# Patient Record
Sex: Male | Born: 1945 | Race: Black or African American | Hispanic: No | Marital: Married | Smoking: Former smoker
Health system: Southern US, Community
[De-identification: ages and names within clinical notes are randomized; demographics above are authoritative.]

## PROBLEM LIST (undated history)

## (undated) DIAGNOSIS — K219 Gastro-esophageal reflux disease without esophagitis: Secondary | ICD-10-CM

## (undated) DIAGNOSIS — D649 Anemia, unspecified: Secondary | ICD-10-CM

## (undated) DIAGNOSIS — M1990 Unspecified osteoarthritis, unspecified site: Secondary | ICD-10-CM

## (undated) DIAGNOSIS — I10 Essential (primary) hypertension: Secondary | ICD-10-CM

## (undated) DIAGNOSIS — Z809 Family history of malignant neoplasm, unspecified: Secondary | ICD-10-CM

## (undated) DIAGNOSIS — Z1379 Encounter for other screening for genetic and chromosomal anomalies: Secondary | ICD-10-CM

## (undated) HISTORY — DX: Family history of malignant neoplasm, unspecified: Z80.9

## (undated) HISTORY — PX: KNEE SURGERY: SHX244

## (undated) HISTORY — PX: SHOULDER SURGERY: SHX246

## (undated) HISTORY — PX: JOINT REPLACEMENT: SHX530

## (undated) HISTORY — DX: Encounter for other screening for genetic and chromosomal anomalies: Z13.79

---

## 2004-04-04 ENCOUNTER — Emergency Department (HOSPITAL_COMMUNITY): Admission: EM | Admit: 2004-04-04 | Discharge: 2004-04-04 | Payer: Self-pay | Admitting: Emergency Medicine

## 2006-09-08 ENCOUNTER — Emergency Department (HOSPITAL_COMMUNITY): Admission: EM | Admit: 2006-09-08 | Discharge: 2006-09-08 | Payer: Self-pay | Admitting: Emergency Medicine

## 2008-01-05 ENCOUNTER — Emergency Department (HOSPITAL_COMMUNITY): Admission: EM | Admit: 2008-01-05 | Discharge: 2008-01-05 | Payer: Self-pay | Admitting: *Deleted

## 2008-03-13 ENCOUNTER — Inpatient Hospital Stay (HOSPITAL_COMMUNITY): Admission: RE | Admit: 2008-03-13 | Discharge: 2008-03-20 | Payer: Self-pay | Admitting: Orthopedic Surgery

## 2009-05-07 ENCOUNTER — Inpatient Hospital Stay (HOSPITAL_COMMUNITY): Admission: RE | Admit: 2009-05-07 | Discharge: 2009-05-10 | Payer: Self-pay | Admitting: Otolaryngology

## 2011-04-01 LAB — BASIC METABOLIC PANEL
BUN: 16 mg/dL (ref 6–23)
CO2: 29 mEq/L (ref 19–32)
CO2: 26 mEq/L (ref 19–32)
Calcium: 8.5 mg/dL (ref 8.4–10.5)
Calcium: 10 mg/dL (ref 8.4–10.5)
Chloride: 110 mEq/L (ref 96–112)
Creatinine, Ser: 1.23 mg/dL (ref 0.4–1.5)
GFR calc Af Amer: >60 mL/min (ref >60)
GFR calc Af Amer: >60 mL/min (ref >60)
GFR calc non Af Amer: 59 mL/min — ABNORMAL LOW (ref >60)
Glucose, Bld: 134 mg/dL — ABNORMAL HIGH (ref 70–99)
Potassium: 4.7 mEq/L (ref 3.5–5.1)

## 2011-04-01 LAB — URINALYSIS, ROUTINE W REFLEX MICROSCOPIC
Bilirubin Urine: NEGATIVE
Glucose, UA: 100 mg/dL — AB
Hgb urine dipstick: NEGATIVE
Leukocytes, UA: NEGATIVE
Nitrite: NEGATIVE
Urobilinogen, UA: 1 mg/dL (ref 0.0–1.0)

## 2011-04-01 LAB — CBC
HCT: 23.2 % — ABNORMAL LOW (ref 39.0–52.0)
HCT: 25.7 % — ABNORMAL LOW (ref 39.0–52.0)
Hemoglobin: 8.6 g/dL — ABNORMAL LOW (ref 13.0–17.0)
Hemoglobin: 12.5 g/dL — ABNORMAL LOW (ref 13.0–17.0)
MCHC: 33.4 g/dL (ref 30.0–36.0)
MCHC: 33.4 g/dL (ref 30.0–36.0)
Platelets: 219 10*3/uL (ref 150–400)
RBC: 2.89 MIL/uL — ABNORMAL LOW (ref 4.22–5.81)
RBC: 3.28 MIL/uL — ABNORMAL LOW (ref 4.22–5.81)
RDW: 13 % (ref 11.5–15.5)
RDW: 13.7 % (ref 11.5–15.5)
WBC: 6.8 10*3/uL (ref 4.0–10.5)
WBC: 7.4 10*3/uL (ref 4.0–10.5)

## 2011-04-01 LAB — TYPE AND SCREEN: ABO/RH(D): A POS

## 2011-04-01 LAB — PROTIME-INR
INR: 1.1 (ref 0.00–1.49)
Prothrombin Time: 16 seconds — ABNORMAL HIGH (ref 11.6–15.2)
Prothrombin Time: 16.3 seconds — ABNORMAL HIGH (ref 11.6–15.2)

## 2011-04-01 LAB — DIFFERENTIAL
Basophils Relative: 1 % (ref 0–1)
Lymphocytes Relative: 26 % (ref 12–46)
Lymphs Abs: 1.3 10*3/uL (ref 0.7–4.0)
Monocytes Absolute: 0.5 10*3/uL (ref 0.1–1.0)
Neutro Abs: 2.8 10*3/uL (ref 1.7–7.7)
Neutrophils Relative %: 58 % (ref 43–77)

## 2011-05-06 NOTE — Op Note (Signed)
NAME:  Alexander Strickland, Alexander Strickland NO.:  [id number]   MEDICAL RECORD NO.:  [id number]          PATIENT TYPE:  INP   LOCATION:  5028                         FACILITY:  [facility]   PHYSICIAN:  Feliberto Gottron. Turner Daniels, M.D.   DATE OF BIRTH:  [birth year]   DATE OF PROCEDURE:  05/07/2009  DATE OF DISCHARGE:                               OPERATIVE REPORT   PREOPERATIVE DIAGNOSIS:  End-stage arthritis right knee.   POSTOPERATIVE DIAGNOSIS:  End-stage arthritis right knee.   PROCEDURE:  Right total knee arthroplasty using DePuy Sigma RP  components 5 femur and 5 tibia, 15-mm bearing, 41-mm patellar button,  double batch of DePuy 1 cement with 1500 mg of Zinacef.   SURGEON:  Feliberto Gottron.  Turner Daniels, MD   FIRST ASSISTANT:  Shirl Harris, PA-C   ANESTHETIC:  General endotracheal.   ESTIMATED BLOOD LOSS:  Minimal.   FLUID REPLACEMENT:  1800 mL of crystalloid.   DRAINS PLACED:  Foley catheter and two medium Hemovacs.   URINE OUTPUT:  300 mL.   TOURNIQUET TIME:  1 hour and 45 minutes.   INDICATIONS FOR PROCEDURE:  A 65 year old gentleman who was previously  had a left total knee that was done well, now desires elective right  total knee for end-stage arthritis with huge medial osteophytes, bare  bone, arthritic changes to the medial compartment, varus deformity, and  about a 20 degrees flexion contracture.  Risks and benefits of surgery  were discussed and were well known to the patient.  He desires elective  total knee arthroplasty to decrease pain and increase function and was  failed all conservative measures.   DESCRIPTION OF PROCEDURE:  The patient was identified by armband and  received preoperative IV antibiotics in the holding area at [facility] and also underwent right femoral nerve block, tolerated the  procedure well, was taken to operating room #10 where the appropriate  anesthetic monitors were reattached and general endotracheal anesthesia  induced with the patient in supine  position.  Foley catheter was  inserted.  Tourniquet applied high to the right thigh, foot positioner  lateral post applied to the table and the right lower extremity was  prepped and draped in usual sterile fashion from the ankle to the  midthigh.  Time-out procedure performed.  Limb wrapped with an Esmarch  bandage, tourniquet inflated to 350 mmHg, began the operation by making  anterior midline incision going from one handbreadth above the patella  over the patella and 1 cm medial to and 3 cm distal to the tibial  tubercle.  Because of the varus contracture and the flexion contracture,  the inferior end of the incision was about 3 cm longer than usual.  Small bleeders in the skin and subcutaneous tissue identified and  cauterized.  The transverse retinaculum was then incised and reflected  medially allowing medial parapatellar arthrotomy.  The superficial  medial collateral ligament was then elevated off the medial flare of the  tibia going from anterior to posterior, leaving and intact distally.  Prepatellar fat pad was resected and then the patella was everted  exposing huge osteophytes  medially which were removed with a 1-inch  osteotome from the medial femoral condyle, the superior aspect of the  trochlea, and the lateral side of the femoral condyle.  There was also a  large medial osteophyte and even after removal of all these osteophytes,  the patient was still sized for a 5.  We then took a one three-quarter  inch osteotome to remove large notch osteophytes as well as the cruciate  ligaments and we finally were able to get the knee to flex up past 100  degrees to about 130.  We then placed a posterior medial Z-retractor, a  McCullough retractor through the notch and lateral Hohmann retractor and  entered the proximal tibia with a DePuy step drill followed by the IM  rod and a 2-degree posterior slope cutting guide.  Because of the  flexion contracture, we removed about 7-8 mm of  bone medially and 12 mm  of bone laterally.  Satisfied with the proximal tibia resection, we then  direct our attention to the distal femur and entered it 2 mm anterior to  the PCL origin with a step drill followed by a 5-degree right distal  femoral cutting guide set for an 11-mm distal cut.  This was pinned into  placed and when the distal cut accomplished, we sized for a #5 femoral  component set the guide at 3 degrees of external rotation and screwed in  with chamfer cutting guide, performed an anterior-posterior chamfer cuts  without difficulty followed by the Sigma RP box cut.  The patella was  then measured at 29 mm felt to be set for a 41 button and the cutting  guide set at 18 removing the posterior 11 mm of the patella.  We sized  for 41 button and drilled the patella.  The knee was then once again  hyperflexed and sized for a #5 tibial baseplate.  This was pinned into  place followed by the smokestack and the conical reamer and then the  Delta fin keel trial punch was inserted.  The 5 right femoral component  trial was then hammered onto the femur and the lugs drilled.  We tried a  12-15 mm bearing and because of the patient's size.  The 15 bearing had  the best fit and fill and did come to full extension.  At this point,  the trial components were removed, all bony surfaces were water picked,  clean, dried with suction and sponges.  A double batch of DePuy 1 cement  was mixed at the back table and applied to all bony metallic mating  surfaces.  In order, we hammered into place a #5 tibial baseplate and  removed the excess cement.  A #5 right femoral component and removed the  excess cement.  The 15-mm bearing was then snapped into place, the knee  reduced and held in full extension.  The 41 button was then clamped onto  the patella and excess cement also removed.  Medium Hemovac drains were  placed from the anterolateral approach.  The wound was then pulse  lavaged at one more  time with normal saline solution.  After the cement  cured, we checked our patellar tracking, it was excellent.  The  parapatellar arthrotomy was closed with running #1 Vicryl suture, the  subcutaneous tissue with 0 and 2-0 undyed Vicryl suture and the skin  with skin staples.  A dressing of Xeroform, 4 x 4 dressing sponges,  Webril, and Ace wrap and a knee immobilizer  because of the preexisting  flexion contracture then applied.  The tourniquet was let down.  The  patient awakened and taken to recovery room without difficulty.      Feliberto Gottron. Turner Daniels, M.D.  Electronically Signed     FJR/MEDQ  D:  05/07/2009  T:  05/07/2009  Job:  [id number]

## 2011-05-06 NOTE — Discharge Summary (Signed)
NAME:  Alexander Strickland, Alexander Strickland NO.:  [id number]   MEDICAL RECORD NO.:  [id number]          PATIENT TYPE:  INP   LOCATION:  5028                         FACILITY:  [facility]   PHYSICIAN:  Feliberto Gottron. Turner Daniels, M.D.   DATE OF BIRTH:  08/02/1946   DATE OF ADMISSION:  05/07/2009  DATE OF DISCHARGE:  05/10/2009                               DISCHARGE SUMMARY   CHIEF COMPLAINT:  Right knee pain.   HISTORY OF PRESENT ILLNESS:  This is a 65 year old gentleman who  complains of severe unremitting pain in his right knee despite  conservative treatment.  He desires a surgical intervention at this  time.  All risks and benefits were discussed with the patient.   PAST MEDICAL HISTORY:  Significant for hypertension.   ALLERGIES:  He has no known drug allergies.   CURRENT MEDICATIONS:  1. Simvastatin 40 mg one p.o. daily.  2. Atenolol 25 mg one p.o. daily.  3. Hydrocodone 5/500 mg one p.o. q.6 hours p.r.n. pain.   PAST SURGICAL HISTORY:  He had a left total knee arthroplasty in 2009.  He had right knee surgery in 1976.   SOCIAL HISTORY:  He smoked cigars and denies use of alcohol.   FAMILY HISTORY:  Is positive for diabetes mellitus, hypertension and  coronary artery disease.   PHYSICAL EXAMINATION:  Gross examination of the right knee demonstrates  the patient's range of motion to be 5-100 degrees.  He has  patellofemoral crepitus and a varus deformity.  He is neurovascularly  intact.  X-rays demonstrated bone on bone degenerative joint disease of  the right knee.   PREOP LABS:  White blood cells 4.8, red blood cells 4.20, hemoglobin  12.5, hematocrit 37.3, platelets 219.  PT 12.4, INR 0.9, PTT 25, sodium  141, potassium 5.2, chloride 110, BUN 20, creatinine 1.08.  Urinalysis  was within normal limits.   HOSPITAL COURSE:  Alexander Strickland was admitted to [facility] on May 07, 2009, when he underwent right total knee arthroplasty.  The procedure  was performed by Dr. Feliberto Gottron. Turner Daniels and  the patient tolerated it well.  Two Hemovac drains were  placed into the right knee.  A perioperative  Foley catheter was also placed.  The patient was transferred from  recovery to the orthopedic unit and placed on Lovenox and Coumadin for  DVT prophylaxis.  On the first postoperative day the patient was awake  and alert.  Hemoglobin was 9.8.  Surgical drains were removed without  difficulty.  His Foley catheter was removed after physical therapy.  On  the second postoperative day the patient reported improvement in the  nausea he was experiencing on the previous day.  He was tolerating p.o.  intake and passing flatus.  He had ambulated 160 feet with physical  therapy.  Hemoglobin was 8.6 but he denied any shortness of breath or  dizziness.  Surgical dressing was cleaned.  It was changed and his  incision was found to be benign.  On the third postoperative day the  patient was eating well and ambulating well with physical therapy.  Hemoglobin  was 7.8 but he continued to deny dizziness, fatigue or  shortness of breath so he was not transfused.  All risks and benefits of  avoiding transfusion were discussed with the patient.  He has no history  of heart disease so this seemed to be a reasonable course of action.  He  was discharged to a skilled nursing facility.   DISPOSITION:  The patient was discharged to skilled nursing on May 10, 2009.  The skilled nursing facility will manage his wound, Coumadin and  physical therapy.  He is weightbearing as tolerated and will return to  the clinic in 10 days to see Dr. Turner Daniels for x-rays and staple removal.   DISCHARGE MEDICATIONS:  1. As per the HMR with the addition of Percocet 5 mg one to two      tablets p.o. q.4 hours p.r.n. pain.  2. Coumadin dosed by the pharmacy to take as directed with a target      INR of 1.5 to 2.   FINAL DIAGNOSIS:  End-stage degenerative joint disease of the right knee  with a secondary diagnosis of acute blood loss  anemia.      Alexander Harris, PA      Feliberto Gottron. Turner Daniels, M.D.  Electronically Signed    JW/MEDQ  D:  05/10/2009  T:  05/10/2009  Job:  [id number]

## 2011-05-06 NOTE — Op Note (Signed)
NAME:  Alexander Strickland, Alexander Strickland               ACCOUNT NO.:  [id number]   MEDICAL RECORD NO.:  [id number]          PATIENT TYPE:  INP   LOCATION:  2550                         FACILITY:  [facility]   PHYSICIAN:  Feliberto Gottron. Turner Daniels, M.D.   DATE OF BIRTH:  1946/12/10   DATE OF PROCEDURE:  03/13/2008  DATE OF DISCHARGE:                               OPERATIVE REPORT   PREOPERATIVE DIAGNOSIS:  End-stage arthritis, left knee with the 20  degrees flexion contracture, 10 degrees varus deformity.   POSTOPERATIVE DIAGNOSIS:  End-stage arthritis, left knee with the 20  degrees flexion contracture, 10 degrees varus deformity.   PROCEDURE:  Left total knee arthroplasty using DePuy Sigma RP components  cemented 5 femur, 5 tibia, 41-mm patellar button, 10-mm Sigma RP spacer.   SURGEON:  Feliberto Gottron.  Turner Daniels, MD   FIRST ASSISTANT:  Skip Mayer PA-C.   ANESTHETIC:  Endotracheal with femoral nerve block.   ESTIMATED BLOOD LOSS:  Minimal.   TOURNIQUET TIME:  1 hour 15 minutes.   DRAINS PLACED:  Two medium Hemovacs and a Foley catheter.   URINE OUTPUT:  300 mL.   INDICATIONS FOR PROCEDURE:  A 65 year old gentleman who is a [place] [employer]  veteran, presented to our office presented to our office about a month  ago with end-stage arthritis of both knees and a permission slip from  the Department of [facility] allowing him to seek knee arthroplasty  on the civilian economy.  Plain radiographs showed bone-on-bone  arthritic changes with huge osteophytes erosions medially of the femur  into the tibia.  He had truly had end-stage arthritis and severe,  debilitating pain, inability to walk more than about a block at a time.  Risks and benefits of surgery were discussed, questions answered.   DESCRIPTION OF PROCEDURE:  The patient identified by armband, taken to  the block area [facility] where a left femoral nerve block was  placed.  He was then taken to operating room 1, appropriate anesthetic  monitors were  attached and general endotracheal anesthesia induced with  the patient in supine position.  Tourniquet applied high to the left  thigh and the left lower extremity prepped and draped in usual sterile  fashion from the ankle to the tourniquet.  Lateral post and foot  positioner applied to the table.  Limb wrapped with an Esmarch bandage,  tourniquet inflated to 350 mmHg and we began the procedure by making an  anterior midline incision through the skin and subcutaneous tissue down  to the transverse retinaculum which was incised in line with skin  incision.  This allowed a medial parapatellar arthrotomy and eversion of  the patella.  The patient had huge peripheral osteophytes of the patella  that were removed with rongeurs.  Prepatellar fat pad was resected.  Superficial medial collateral ligament was elevated from anterior to  posterior off the anterior aspect of the tibia revealing once again a  large osteophyte and then taken down the metaphyseal flare of the tibia  about 6 cm leaving it intact distally.  Very large peripheral  osteophytes of the femur were  also removed.  This required a 1 inch  osteotome which was very impressive and large notch osteophytes were  also removed revealing the ACL and PCL which were then removed with  electrocautery and again we used an osteotome to remove osteophytes from  the tibial spines the overhanging osteophyte of the medial proximal  tibia going posterior was also removed with rongeurs.  Once this been  accomplished with the knee hyperflexed, we entered the proximal tibia  with a DePuy step drill followed by intramedullary rod and a 0 degrees  slope proximal tibial cutting guide.  This was set allowing a resection  of about 12 mm of bone laterally, only about 4 or 5 mm of bone medially  because of the varus deformity.  The posterior structures were protected  with a Z retractor posterior, a McHale retractor and a lateral Homan  retractor.  The  proximal tibial cut was accomplished without difficulty.  We then entered the distal femur 2 mm anterior to the PCL origin with  the DePuy step drill followed by the IM rod set at rod set at 5 degrees  left again with a 12 mm distal femoral cut.  This was pinned along the  epicondylar axis and the distal femoral cut accomplished.  We sized for  #5 right femoral component and pinned the cutting guide in 3 degrees of  external rotation to allow for the medial contracture.  The anterior-  posterior and chamfer cuts were then accomplished without difficulty  followed by the Sigma RP box cut.  We then checked our extension gap  which was excellent for the 10 mm spacer and the flexion gap was also in  good position for the 10 mm spacer.  At this point, the everted patella  was measured at 25 mm because of the erosion.  We set the cutting guide  at 16 anticipating a 41-mm button, performed the cut on the posterior  aspect of the patella, sized for a 41 button and drilled the lollipop,  the knee was once again hyperflexed.  We sized the tibia to a #5 tibial  baseplate which was pinned into place followed by the smokestack and the  smokestack reamer and the Delta fin keel cut.  The bullet with the Delta  fin trial was then hammered into place.  The 5 left femoral trial was  hammered into place.  The lugs were drilled, a 10 mm spacer was placed  and the knee came to full extension and flexed to 135 degrees and the  varus deformity and flexion contracture were gone.  Ligaments were  stable.  At this point the trial components removed.  All bony surfaces  were water picked clean, dried with suction and sponges, a double batch  of DePuy HV cement with 1500 mg of Zinacef was then mixed, applied to  all bony and metallic mating surfaces and in order we hammered into  place a #5 tibial baseplate and removed excess cement, a 5 left femoral  component, removed excess cement and a 41-mm patellar button was   squeezed into place and held with a clamp.  A 10-mm Sigma RP spacer to  fit the #5 femoral component was then placed on the tibial baseplate and  the knee reduced.  The knee came to full extension and was held in 5  degrees of flexion as the cement hardened.  Medium Hemovac drains were  placed from a lateral approach.  The wound was water picked clean one  more time after the cement hardened.  The patella was unclamped, we  checked tracking one more time.  No thumb pressure was low required on  the patella and the parapatellar arthrotomy was then closed with running  #1 Vicryl suture.  Subcutaneous tissue was closed with 0-0 and 2-0  undyed Vicryl suture and the skin with skin staples.  Dressing of  Xeroform, 4x4 dressing sponges, Webril and Ace wrap applied.  Tourniquet  let down.  The patient awakened and taken to the recovery room without  difficulty.      Feliberto Gottron. Turner Daniels, M.D.  Electronically Signed     FJR/MEDQ  D:  03/13/2008  T:  03/13/2008  Job:  [id number]

## 2011-05-06 NOTE — Discharge Summary (Signed)
NAME:  Alexander Strickland, Alexander Strickland NO.:  [id number]   MEDICAL RECORD NO.:  [id number]          PATIENT TYPE:  INP   LOCATION:  5021                         FACILITY:  [facility]   PHYSICIAN:  Feliberto Gottron. Turner Daniels, M.D.   DATE OF BIRTH:  April 11, 1946   DATE OF ADMISSION:  03/13/2008  DATE OF DISCHARGE:  03/16/2008                               DISCHARGE SUMMARY   FINAL DIAGNOSES:  End-stage degenerative joint disease of the left knee.   PROCEDURES WHILE IN THE HOSPITAL:  Left total knee arthroplasty.   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 Wadie Lessen office approximately 1  month prior to this admission with end-stage arthritis of both knees and  a permission slip from the Department of [facility] allowing him  to seek total knee arthroplasty in the civilian economy.  Plain  radiograph showed bone on bone arthritic changes with huge osteophytes  and positive erosions medially off the femur and at the tibia with end-  stage arthritis with severe debilitating pain.  He has inability to walk  more than a block at a time.  Risks and benefits of surgery discussed  and questions were answered, and he wishes to proceed with a total knee.   He has no known drug allergies.   MEDICATIONS AT THE TIME OF ADMISSION:  Vicodin and atenolol.   PAST MEDICAL HISTORY:  Usual childhood diseases.  Adult history:  Hypertension, DJD.   SURGICAL HISTORY:  Right knee in 1976 for a scope.  Left shoulder 2005.   SOCIAL HISTORY:  Positive for tobacco at 1 pack per day.  Positive  ethanol occasionally.  No IV drug abuse.  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   FAMILY HISTORY:  Father died at age 39 with a history of hypertension,  CHF, and diabetes.  Mother died at 21 with a history of Alzheimer's and  cancer.   REVIEW OF SYSTEMS:  Positive for glasses.  The patient denies any  shortness of breath, chest pain, or recent  illness.   EXAM:  The patient's vital signs at the time of admission exam were  97.3, pulse 60, respirations 16, blood pressure 151/100.  The patient is  6 feet, 216 pounds male.  HEENT:  Normocephalic, atraumatic.  TMs clear.  Pupils are equal, round,  and reactive to light and accommodation.  Throat benign.  He has upper  and lower dentures.  NECK:  Supple.  Full range of motion.  CHEST:  Clear to auscultation and percussion.  HEART:  Regular rate and rhythm.  ABDOMEN:  Soft and nontender.  No masses.  Bowel sounds 2+.  EXTREMITIES:  Left knee range of motion 10-110 degrees with positive  pain, crepitus, and 10 degree varus deformity.  SKIN:  Within normal limits.   X-ray shows bone on bone medial patella and femoral components left  greater than right knees.  Preoperative labs including CBC, CMET, chest  x-ray, EKG, PT/PTT, were all within normal limits with the exception of  a potassium  of 5.5, glucose 119.   HOSPITAL COURSE:  The patient, on date of admission, was seen in the  operating room at [facility] where he underwent a left total knee  arthroplasty using DuPuySigma RP components,  #5 femur, #5 tibia, 41 mm  patellar button, 10 mm Sigma spacer.  Components were all cemented with  polymethyl methacrylate that had been embedded with antibiotics.  The  patient was placed on perioperative antibiotics, placed on postoperative  Coumadin prophylaxis with a target INR 1.5 to 2.  He was placed on PCA  Dilaudid pain control pump.  He was placed on Lovenox for bridging  therapy until he became therapeutic on Coumadin.  Physical therapy was  begun in the postoperative period, even in the PACU with a cpm, and  physical therapy was begun in earnest.  Weightbearing as tolerated the  following day.  Postoperative day 1 the patient was awake and alert, in  moderate pain, 4/10, taking p.o. well.  No nausea or vomiting.  Hemoglobin 9.7,  WBC 7.0, vital signs stable.  He was afebrile, INR  1.1.  Dressing was clean and dry.  Hemovac was discontinued.  Urine output 400  mL, otherwise stable.  Discharge planning was begun and discussion was  made regarding going to short-term stay at a skilled nursing facility  for rehab bed, and bed offers were requested.  Postoperative day 2 the  patient was complaining of moderate pain of the left knee.  T-max 99.3,  vital signs stable.  he was alert and oriented.  Dressing was dry.  Calf  was soft and nontender.  He was otherwise stable.  His laboratory work  on that date showed a drop in hemoglobin to 8.6, but he was asymptomatic  for any type of anemia.  Foley was discontinued without difficulty, as  was his PCA.  Postoperative day 3 the patient was awake and alert,  walking the hallway.  No bowel movement as of yet, but positive flatus.  Taking p.o. well.  Vital signs stable.  Dressing was dry.  Neurovascularly intact.  Hemoglobin of 8.5.  No dizziness, fatigue, or  other symptoms of anemia.  Wound was clean and dry.  Skilled nursing bed  became available for rehab physician.  He was transferred on that date  to the skilled nursing facility.   FINAL DIAGNOSIS:  Osteoarthritis, left knee, procedure in hospital left  total knee arthroplasty.   MEDICATIONS AT TIME OF DISCHARGE:  1. Coumadin per pharmacy protocol with a target INR of 1.5 to 2 for 2      weeks postoperatively.  2. Colace 100 mg b.i.d.  3. Tenormin 25 mg p.o. daily.  4. Senokot as needed for laxative or laxative of choice.  5. Fleets enema as needed.  6. Percocet 1 to 2 q.4h p.r.n. pain 5/325.  7. Tylenol 325 one to two q.4h p.r.n. pain of minor nature or      temperature greater than 101.  8. Reglan 10 mg p.o. q.8 as needed for nausea.  9. Phenergan 12.5 to 25 mg q.6 p.r.n. nausea.  10.Robaxin 500 mg p.o. q.6h p.r.n. spasm.  11.Benadryl as needed for sleep.   He should return to clinic in 1 week's time for followup, check with Dr.  Turner Daniels calling [phone] for  followup check.   ACTIVITIES:  Weightbearing as tolerated with total knee precautions  using a walker.  He may shower postoperative day 7 in a seated shower  chair with assistance.  Dressing changes daily  or as needed keeping the  wound clean and dry.  He should continue with CPM until he reaches 90  degrees easily with active range of motion 5 hours a day, increasing by  10 degrees by each day for a total of 5 hours each day.   DIET:  Regular.      Laural Benes. Jannet Mantis.      Feliberto Gottron. Turner Daniels, M.D.  Electronically Signed    JBR/MEDQ  D:  03/16/2008  T:  03/16/2008  Job:  [id number]

## 2011-09-15 LAB — BASIC METABOLIC PANEL
BUN: 15
CO2: 27
Calcium: 8.7
GFR calc non Af Amer: >60
Glucose, Bld: 166 — ABNORMAL HIGH

## 2011-09-15 LAB — TYPE AND SCREEN
ABO/RH(D): A POS
Antibody Screen: NEGATIVE

## 2011-09-15 LAB — ABO/RH: ABO/RH(D): A POS

## 2011-09-15 LAB — PROTIME-INR
INR: 1.1
INR: 1.3
INR: 2.2 — ABNORMAL HIGH
Prothrombin Time: 12.1
Prothrombin Time: 16.5 — ABNORMAL HIGH
Prothrombin Time: 16.6 — ABNORMAL HIGH
Prothrombin Time: 26.1 — ABNORMAL HIGH
Prothrombin Time: 26.2 — ABNORMAL HIGH

## 2011-09-15 LAB — CBC
HCT: 39.7
MCHC: 33.1
MCHC: 33.6
MCHC: 33.7
MCV: 89.3
Platelets: 185
Platelets: 203
Platelets: 228
RBC: 2.86 — ABNORMAL LOW
RBC: 4.47
RDW: 13.4
RDW: 13.6
RDW: 13.7
WBC: 7.3
WBC: 7.4

## 2011-09-15 LAB — URINALYSIS, ROUTINE W REFLEX MICROSCOPIC
Glucose, UA: NEGATIVE
Hgb urine dipstick: NEGATIVE
Specific Gravity, Urine: 1.025
pH: 5

## 2011-09-15 LAB — COMPREHENSIVE METABOLIC PANEL
ALT: 31
AST: 25
Alkaline Phosphatase: 71
GFR calc Af Amer: >60
GFR calc non Af Amer: >60
Potassium: 5.5 — ABNORMAL HIGH
Sodium: 140

## 2011-09-15 LAB — HEMOGLOBIN AND HEMATOCRIT, BLOOD: Hemoglobin: 10.9 — ABNORMAL LOW

## 2011-09-15 LAB — DIFFERENTIAL
Lymphocytes Relative: 41
Monocytes Absolute: 0.5
Neutrophils Relative %: 45

## 2012-07-03 ENCOUNTER — Emergency Department (HOSPITAL_COMMUNITY): Payer: Medicare Other

## 2012-07-03 ENCOUNTER — Encounter (HOSPITAL_COMMUNITY): Payer: Self-pay | Admitting: *Deleted

## 2012-07-03 ENCOUNTER — Observation Stay (HOSPITAL_COMMUNITY)
Admission: EM | Admit: 2012-07-03 | Discharge: 2012-07-06 | Disposition: A | Discharge status: Home / Self Care | Payer: Medicare Other | Attending: Internal Medicine | Admitting: Internal Medicine

## 2012-07-03 DIAGNOSIS — I10 Essential (primary) hypertension: Secondary | ICD-10-CM | POA: Diagnosis present

## 2012-07-03 DIAGNOSIS — R079 Chest pain, unspecified: Secondary | ICD-10-CM

## 2012-07-03 DIAGNOSIS — R001 Bradycardia, unspecified: Secondary | ICD-10-CM | POA: Diagnosis present

## 2012-07-03 DIAGNOSIS — R42 Dizziness and giddiness: Principal | ICD-10-CM | POA: Insufficient documentation

## 2012-07-03 DIAGNOSIS — D649 Anemia, unspecified: Secondary | ICD-10-CM | POA: Insufficient documentation

## 2012-07-03 DIAGNOSIS — I498 Other specified cardiac arrhythmias: Secondary | ICD-10-CM | POA: Insufficient documentation

## 2012-07-03 DIAGNOSIS — T448X5A Adverse effect of centrally-acting and adrenergic-neuron-blocking agents, initial encounter: Secondary | ICD-10-CM | POA: Insufficient documentation

## 2012-07-03 DIAGNOSIS — R0789 Other chest pain: Secondary | ICD-10-CM | POA: Insufficient documentation

## 2012-07-03 HISTORY — DX: Anemia, unspecified: D64.9

## 2012-07-03 HISTORY — DX: Essential (primary) hypertension: I10

## 2012-07-03 LAB — CBC WITH DIFFERENTIAL/PLATELET
Eosinophils Absolute: 0.2 10*3/uL (ref 0.0–0.7)
Hemoglobin: 11.1 g/dL — ABNORMAL LOW (ref 13.0–17.0)
Lymphocytes Relative: 29 % (ref 12–46)
Lymphs Abs: 1.8 10*3/uL (ref 0.7–4.0)
MCH: 28.3 pg (ref 26.0–34.0)
Monocytes Relative: 8 % (ref 3–12)
Neutro Abs: 3.8 10*3/uL (ref 1.7–7.7)
Neutrophils Relative %: 59 % (ref 43–77)
Platelets: 218 10*3/uL (ref 150–400)
RBC: 3.92 MIL/uL — ABNORMAL LOW (ref 4.22–5.81)
WBC: 6.4 10*3/uL (ref 4.0–10.5)

## 2012-07-03 LAB — URINALYSIS, ROUTINE W REFLEX MICROSCOPIC
Glucose, UA: 100 mg/dL — AB
Hgb urine dipstick: NEGATIVE
Leukocytes, UA: NEGATIVE
Protein, ur: NEGATIVE mg/dL
Specific Gravity, Urine: 1.027 (ref 1.005–1.030)
pH: 5.5 (ref 5.0–8.0)

## 2012-07-03 LAB — CARDIAC PANEL(CRET KIN+CKTOT+MB+TROPI): Total CK: 279 U/L — ABNORMAL HIGH (ref 7–232)

## 2012-07-03 LAB — POCT I-STAT, CHEM 8
BUN: 21 mg/dL (ref 6–23)
Calcium, Ion: 1.23 mmol/L (ref 1.13–1.30)
Creatinine, Ser: 1.2 mg/dL (ref 0.50–1.35)
Glucose, Bld: 106 mg/dL — ABNORMAL HIGH (ref 70–99)
TCO2: 25 mmol/L (ref 0–100)

## 2012-07-03 LAB — TROPONIN I: Troponin I: <0.3 ng/mL (ref <0.30)

## 2012-07-03 MED ORDER — SODIUM CHLORIDE 0.9 % IV SOLN
INTRAVENOUS | Status: DC
  Administered 2012-07-03 – 2012-07-06 (×3): via INTRAVENOUS

## 2012-07-03 MED ORDER — ONDANSETRON HCL 4 MG PO TABS: 4.0000 mg | ORAL_TABLET | Freq: Four times a day (QID) | ORAL | Status: DC | PRN

## 2012-07-03 MED ORDER — HYDRALAZINE HCL 20 MG/ML IJ SOLN
10.0000 mg | Freq: Four times a day (QID) | INTRAMUSCULAR | Status: DC | PRN
  Administered 2012-07-04: 10 mg via INTRAVENOUS
  Filled 2012-07-03: qty 1

## 2012-07-03 MED ORDER — ACETAMINOPHEN 650 MG RE SUPP: 650.0000 mg | Freq: Four times a day (QID) | RECTAL | Status: DC | PRN

## 2012-07-03 MED ORDER — ENOXAPARIN SODIUM 40 MG/0.4ML ~~LOC~~ SOLN
40.0000 mg | SUBCUTANEOUS | Status: DC
  Administered 2012-07-04 – 2012-07-05 (×3): 40 mg via SUBCUTANEOUS
  Filled 2012-07-03 (×5): qty 0.4

## 2012-07-03 MED ORDER — ACETAMINOPHEN 325 MG PO TABS
650.0000 mg | ORAL_TABLET | Freq: Four times a day (QID) | ORAL | Status: DC | PRN
  Administered 2012-07-04 (×2): 650 mg via ORAL
  Filled 2012-07-03 (×2): qty 2
  Filled 2012-07-03: qty 1

## 2012-07-03 MED ORDER — ATROPINE SULFATE 0.1 MG/ML IJ SOLN: 0.5000 mg | INTRAMUSCULAR | Status: DC | PRN

## 2012-07-03 MED ORDER — OXYCODONE HCL 5 MG PO TABS
5.0000 mg | ORAL_TABLET | ORAL | Status: DC | PRN
  Administered 2012-07-03 – 2012-07-06 (×6): 5 mg via ORAL
  Filled 2012-07-03 (×7): qty 1

## 2012-07-03 MED ORDER — SODIUM CHLORIDE 0.45 % IV SOLN
INTRAVENOUS | Status: DC
  Administered 2012-07-03: 75 mL/h via INTRAVENOUS

## 2012-07-03 MED ORDER — ALUM & MAG HYDROXIDE-SIMETH 200-200-20 MG/5ML PO SUSP: 30.0000 mL | Freq: Four times a day (QID) | ORAL | Status: DC | PRN

## 2012-07-03 MED ORDER — PANTOPRAZOLE SODIUM 40 MG PO TBEC
80.0000 mg | DELAYED_RELEASE_TABLET | Freq: Every day | ORAL | Status: DC
  Administered 2012-07-04 – 2012-07-05 (×2): 80 mg via ORAL
  Filled 2012-07-03 (×3): qty 2

## 2012-07-03 MED ORDER — HYDROMORPHONE HCL PF 1 MG/ML IJ SOLN
0.5000 mg | INTRAMUSCULAR | Status: DC | PRN
  Administered 2012-07-03 – 2012-07-04 (×2): 1 mg via INTRAVENOUS
  Filled 2012-07-03 (×3): qty 1

## 2012-07-03 MED ORDER — ONDANSETRON HCL 4 MG/2ML IJ SOLN: 4.0000 mg | Freq: Four times a day (QID) | INTRAMUSCULAR | Status: DC | PRN

## 2012-07-03 MED ORDER — ASPIRIN EC 325 MG PO TBEC
325.0000 mg | DELAYED_RELEASE_TABLET | Freq: Every day | ORAL | Status: DC
  Administered 2012-07-04 – 2012-07-06 (×4): 325 mg via ORAL
  Filled 2012-07-03 (×5): qty 1

## 2012-07-03 MED ORDER — ZOLPIDEM TARTRATE 5 MG PO TABS: 5.0000 mg | ORAL_TABLET | Freq: Every evening | ORAL | Status: DC | PRN

## 2012-07-03 MED ORDER — OMEPRAZOLE MAGNESIUM 20 MG PO TBEC: 40.0000 mg | DELAYED_RELEASE_TABLET | Freq: Two times a day (BID) | ORAL | Status: DC

## 2012-07-03 NOTE — ED Notes (Signed)
Pt now complains of chest pain. 

## 2012-07-03 NOTE — ED Provider Notes (Signed)
History     CSN: [id number]  Arrival date & time 07/03/12  1423   First MD Initiated Contact with Patient 07/03/12 1556      Chief Complaint  Patient presents with  . Chest Pain  . Dizziness  . Neck Pain  . Shoulder Pain    (Consider location/radiation/quality/duration/timing/severity/associated sxs/prior treatment) Patient is a 66 y.o. male presenting with chest pain, neck pain, and shoulder pain. The history is provided by the patient.  Chest Pain Primary symptoms include dizziness. Pertinent negatives for primary symptoms include no shortness of breath, no abdominal pain, no nausea and no vomiting.  Dizziness does not occur with nausea, vomiting or weakness.  Pertinent negatives for associated symptoms include no numbness and no weakness.    Neck Pain  Associated symptoms include chest pain. Pertinent negatives include no numbness, no headaches and no weakness.  Shoulder Pain Associated symptoms include chest pain. Pertinent negatives include no abdominal pain, no headaches and no shortness of breath.   patient's had episodes of dizziness since last night. They come and go. He states that he is a new will also have some pain in his chest neck and shoulder. No shortness of breath or nausea he states the dizziness is that he feels unsteady. He does not describe lightheadedness or feelings of surrounding moving. He states he feels as if his feet would not keep up with him. He's had a dull chest pain and time. No fevers. He's had a dull headache. A trauma. He was able to ride his bike today for 10 miles without difficulty. He's had episodes like this recently, but  never before yesterday.  Past Medical History  Diagnosis Date  . Hypertension   . Anemia     Past Surgical History  Procedure Date  . Knee surgery     Bilateral TKRs  . Shoulder surgery     Left Rotator Cuff Repair    Family History  Problem Relation Age of Onset  . Coronary artery disease Father   . Coronary  artery disease Brother   . Coronary artery disease Sister     X 2  . Diabetes type II Father   . Diabetes type II Brother   . Heart failure Mother     History  Substance Use Topics  . Smoking status: Former Games developer  . Smokeless tobacco: Not on file  . Alcohol Use: No     History of ETOH abuse, no drinks x3 years      Review of Systems  Constitutional: Negative for activity change and appetite change.  HENT: Positive for neck pain. Negative for neck stiffness.   Eyes: Negative for pain.  Respiratory: Negative for chest tightness and shortness of breath.   Cardiovascular: Positive for chest pain. Negative for leg swelling.  Gastrointestinal: Negative for nausea, vomiting, abdominal pain and diarrhea.  Genitourinary: Negative for flank pain.  Musculoskeletal: Negative for back pain.  Skin: Negative for rash.  Neurological: Positive for dizziness. Negative for weakness, numbness and headaches.  Psychiatric/Behavioral: Negative for behavioral problems.    Allergies  Review of patient's allergies indicates no known allergies.  Home Medications   No current outpatient prescriptions on file.  BP 182/88  Pulse 43  Temp 97.7 F (36.5 C) (Oral)  Resp 16  Ht 6' (1.829 m)  Wt 217 lb (98.431 kg)  BMI 29.43 kg/m2  SpO2 99%  Physical Exam  Nursing note and vitals reviewed. Constitutional: He is oriented to person, place, and time. He appears well-developed  and well-nourished.  HENT:  Head: Normocephalic and atraumatic.  Eyes: EOM are normal. Pupils are equal, round, and reactive to light.  Neck: Normal range of motion. Neck supple.  Cardiovascular: Regular rhythm and normal heart sounds.   No murmur heard.      Bradycardia  Pulmonary/Chest: Effort normal and breath sounds normal. He exhibits tenderness.       Tenderness to left anterior chest wall. All  Abdominal: Soft. Bowel sounds are normal. He exhibits no distension and no mass. There is no tenderness. There is no  rebound and no guarding.  Musculoskeletal: Normal range of motion. He exhibits no edema.  Neurological: He is alert and oriented to person, place, and time. No cranial nerve deficit.       Extraocular movements intact. Pupils equal round react to light. Finger-nose intact bilaterally. Grip strength bilaterally.  Skin: Skin is warm and dry.  Psychiatric: He has a normal mood and affect.    ED Course  Procedures (including critical care time)  Labs Reviewed  CBC WITH DIFFERENTIAL - Abnormal; Notable for the following:    RBC 3.92 (*)     Hemoglobin 11.1 (*)     HCT 33.6 (*)     All other components within normal limits  URINALYSIS, ROUTINE W REFLEX MICROSCOPIC - Abnormal; Notable for the following:    Glucose, UA 100 (*)     All other components within normal limits  POCT I-STAT, CHEM 8 - Abnormal; Notable for the following:    Glucose, Bld 106 (*)     Hemoglobin 12.6 (*)     HCT 37.0 (*)     All other components within normal limits  CARDIAC PANEL(CRET KIN+CKTOT+MB+TROPI) - Abnormal; Notable for the following:    Total CK 279 (*)     CK, MB 6.4 (*)     All other components within normal limits  TROPONIN I  BASIC METABOLIC PANEL  CBC  CARDIAC PANEL(CRET KIN+CKTOT+MB+TROPI)  TSH   Dg Chest 2 View  07/03/2012  *RADIOLOGY REPORT*  Clinical Data: Chest pain.  Dyspnea.  Dizziness.  CHEST - 2 VIEW  Comparison: 05/02/2009  Findings: Low lung volumes are noted.  Mild right basilar scarring is unchanged.  No evidence of pulmonary infiltrate or edema.  No evidence of pleural effusion.  Heart size is normal.  No mass or lymphadenopathy identified.  IMPRESSION: Low lung volumes.  Stable right basilar scarring.  No acute findings.  Original Report Authenticated By: Danae Orleans, M.D.   Ct Head Wo Contrast  07/03/2012  *RADIOLOGY REPORT*  Clinical Data: Worsening dizziness.  CT HEAD WITHOUT CONTRAST  Technique:  Contiguous axial images were obtained from the base of the skull through the  vertex without contrast.  Comparison: 01/05/2008  Findings: There is no evidence of intracranial hemorrhage, brain edema or other signs of acute infarction.  There is no evidence of intracranial mass lesion or mass effect.  No abnormal extra-axial fluid collections are identified.  No evidence of hydrocephalus.  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  Chronic small vessel disease again noted.  No skull abnormality identified.  IMPRESSION:  1.  No acute intracranial abnormality. 2.  Chronic small vessel disease.  Original Report Authenticated By: Danae Orleans, M.D.     1. Chest pain   2. Bradycardia   3. Dizziness   4. Hypertension   5. Anemia      Date: 07/03/2012  Rate: 47  Rhythm:  sinus bradycardia  QRS Axis: normal  Intervals: normal  ST/T Wave abnormalities: normal  Conduction Disutrbances:none  Narrative Interpretation:   Old EKG Reviewed: unchanged    MDM  Patient with dizziness and chest pain. He is bradycardic. EKG is reassuring except for the bradycardia. Lab work is overall reassuring head CT is negative. Patient will be admitted for further workup.        Juliet Rude. Rubin Payor, MD 07/03/12 2355

## 2012-07-03 NOTE — H&P (Addendum)
DATE OF ADMISSION:  07/03/2012  PCP:  [facility]   Chief Complaint: Chest Pain, and Dizziness   HPI: Alexander Strickland is an 66 y.o. male  Complaints of Left Sided Chest pain since the Afternoon, he denies Nausea or vomiting or diaphoresis.  He has had Dizziness and Light headedness for the past 4 days. He denies Headache or visual changes.    Past Medical History  Diagnosis Date  . Hypertension   . Anemia        GERD  Past Surgical History  Procedure Date  . Knee surgery     Bilateral TKRs  . Shoulder surgery     Left Rotator Cuff Repair    Medications:  HOME MEDS: Prior to Admission medications   Medication Sig Start Date End Date Taking? Authorizing Provider  atenolol (TENORMIN) 25 MG tablet Take 25 mg by mouth daily.   Yes Historical Provider, MD  omeprazole (PRILOSEC OTC) 20 MG tablet Take 20 mg by mouth daily.   Yes Historical Provider, MD    Allergies:  No Known Allergies  Social History:   reports that he has quit smoking. He does not have any smokeless tobacco history on file. He reports that he does not drink alcohol or use illicit drugs.  Family History: Family History  Problem Relation Age of Onset  . Coronary artery disease Father   . Coronary artery disease Brother   . Coronary artery disease Sister     X 2  . Diabetes type II Father   . Diabetes type II Brother   . Heart failure Mother     Review of Systems:  chest pain, Dizziness The patient denies anorexia, fever, weight loss, vision loss, decreased hearing, hoarseness, syncope, dyspnea on exertion, peripheral edema, balance deficits, hemoptysis, abdominal pain, melena, hematochezia, severe indigestion/heartburn, hematuria, incontinence, genital sores, muscle weakness, suspicious skin lesions, transient blindness, difficulty walking, depression, unusual weight change, abnormal bleeding, enlarged lymph nodes, angioedema, and breast masses.   Physical Exam:  GEN:  Pleasant  66 year old well nourished and  well developed African American male examined  and in no acute distress; cooperative with exam Filed Vitals:   07/03/12 1451 07/03/12 1545 07/03/12 1558 07/03/12 1955  BP: 135/74 129/78 129/78 139/76  Pulse: 50 46    Temp: 97.7 F (36.5 C)     TempSrc: Oral     Resp: 19 11 11 18   SpO2: 98% 96% 98% 100%   Blood pressure 139/76, pulse 46, temperature 97.7 F (36.5 C), temperature source Oral, resp. rate 18, SpO2 100.00%. PSYCH: He is alert and oriented x4; does not appear anxious does not appear depressed; affect is normal HEENT: Normocephalic and Atraumatic, Mucous membranes pink; PERRLA; EOM intact; Fundi:  Benign;  No scleral icterus, Nares: Patent, Oropharynx: Clear, Fair Dentition, Neck:  FROM, no cervical lymphadenopathy nor thyromegaly or carotid bruit; no JVD; Breasts:: Not examined CHEST WALL: No tenderness CHEST: Normal respiration, clear to auscultation bilaterally HEART: + Bradycardia, and regular, no murmurs rubs or gallops BACK: No kyphosis or scoliosis; no CVA tenderness ABDOMEN: Positive Bowel Sounds, Scaphoid,soft non-tender; no masses, no organomegaly. Rectal Exam: Not done EXTREMITIES: No bone or joint deformity; age-appropriate arthropathy of the hands and knees; no cyanosis, clubbing or edema; no ulcerations. Genitalia: not examined PULSES: 2+ and symmetric SKIN: Normal hydration no rash or ulceration CNS: Cranial nerves 2-12 grossly intact no focal neurologic deficit   Labs & Imaging Results for orders placed during the hospital encounter of 07/03/12 (from the past  48 hour(s))  CBC WITH DIFFERENTIAL     Status: Abnormal   Collection Time   07/03/12  4:09 PM      Component Value Range Comment   WBC 6.4  4.0 - 10.5 K/uL    RBC 3.92 (*) 4.22 - 5.81 MIL/uL    Hemoglobin 11.1 (*) 13.0 - 17.0 g/dL    HCT 54.0 (*) 98.1 - 52.0 %    MCV 85.7  78.0 - 100.0 fL    MCH 28.3  26.0 - 34.0 pg    MCHC 33.0  30.0 - 36.0 g/dL    RDW 19.1  47.8 - 29.5 %    Platelets 218  150  - 400 K/uL    Neutrophils Relative 59  43 - 77 %    Neutro Abs 3.8  1.7 - 7.7 K/uL    Lymphocytes Relative 29  12 - 46 %    Lymphs Abs 1.8  0.7 - 4.0 K/uL    Monocytes Relative 8  3 - 12 %    Monocytes Absolute 0.5  0.1 - 1.0 K/uL    Eosinophils Relative 4  0 - 5 %    Eosinophils Absolute 0.2  0.0 - 0.7 K/uL    Basophils Relative 0  0 - 1 %    Basophils Absolute 0.0  0.0 - 0.1 K/uL   TROPONIN I     Status: Normal   Collection Time   07/03/12  4:09 PM      Component Value Range Comment   Troponin I <0.30  <0.30 ng/mL   POCT I-STAT, CHEM 8     Status: Abnormal   Collection Time   07/03/12  4:34 PM      Component Value Range Comment   Sodium 145  135 - 145 mEq/L    Potassium 4.1  3.5 - 5.1 mEq/L    Chloride 109  96 - 112 mEq/L    BUN 21  6 - 23 mg/dL    Creatinine, Ser 6.21  0.50 - 1.35 mg/dL    Glucose, Bld 308 (*) 70 - 99 mg/dL    Calcium, Ion 6.57  8.46 - 1.30 mmol/L    TCO2 25  0 - 100 mmol/L    Hemoglobin 12.6 (*) 13.0 - 17.0 g/dL    HCT 96.2 (*) 95.2 - 52.0 %   URINALYSIS, ROUTINE W REFLEX MICROSCOPIC     Status: Abnormal   Collection Time   07/03/12  4:45 PM      Component Value Range Comment   Color, Urine YELLOW  YELLOW    APPearance CLEAR  CLEAR    Specific Gravity, Urine 1.027  1.005 - 1.030    pH 5.5  5.0 - 8.0    Glucose, UA 100 (*) NEGATIVE mg/dL    Hgb urine dipstick NEGATIVE  NEGATIVE    Bilirubin Urine NEGATIVE  NEGATIVE    Ketones, ur NEGATIVE  NEGATIVE mg/dL    Protein, ur NEGATIVE  NEGATIVE mg/dL    Urobilinogen, UA 0.2  0.0 - 1.0 mg/dL    Nitrite NEGATIVE  NEGATIVE    Leukocytes, UA NEGATIVE  NEGATIVE MICROSCOPIC NOT DONE ON URINES WITH NEGATIVE PROTEIN, BLOOD, LEUKOCYTES, NITRITE, OR GLUCOSE <1000 mg/dL.   Dg Chest 2 View  07/03/2012  *RADIOLOGY REPORT*  Clinical Data: Chest pain.  Dyspnea.  Dizziness.  CHEST - 2 VIEW  Comparison: 05/02/2009  Findings: Low lung volumes are noted.  Mild right basilar scarring is unchanged.  No evidence of pulmonary  infiltrate or edema.  No evidence of pleural effusion.  Heart size is normal.  No mass or lymphadenopathy identified.  IMPRESSION: Low lung volumes.  Stable right basilar scarring.  No acute findings.  Original Report Authenticated By: Danae Orleans, M.D.   Ct Head Wo Contrast  07/03/2012  *RADIOLOGY REPORT*  Clinical Data: Worsening dizziness.  CT HEAD WITHOUT CONTRAST  Technique:  Contiguous axial images were obtained from the base of the skull through the vertex without contrast.  Comparison: 01/05/2008  Findings: There is no evidence of intracranial hemorrhage, brain edema or other signs of acute infarction.  There is no evidence of intracranial mass lesion or mass effect.  No abnormal extra-axial fluid collections are identified.  No evidence of hydrocephalus.  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  Chronic small vessel disease again noted.  No skull abnormality identified.  IMPRESSION:  1.  No acute intracranial abnormality. 2.  Chronic small vessel disease.  Original Report Authenticated By: Danae Orleans, M.D.     EKG:  Bradycardia Regular Rate 47, No Acute ST Changes    Assessment: Present on Admission:  .Dizziness - light-headed .Bradycardia .Hypertension .Chest pain .Anemia    Plan:    Admit to Stepdown ICU due to bradycardia.   Cardiac Enzymes, O2 ASA Discontinue Beta Blocker Rx PRN IV hydralazine Anemia Panel and Hemeccult Stool.   Reconcile home medications.   DVT Prophylaxis Other plans as per orders.    CODE STATUS:      FULL CODE      Shantaya Bluestone C 07/03/2012, 9:13 PM

## 2012-07-03 NOTE — Progress Notes (Signed)
07-03-12 2345 NSG:  Pt admitted to floor and continues to have occassional dizziness when standing up.  C/o 8/10 pain in bil hips (chronic) and 3/10 pain headache, no other nuero deficits, .  bp is 182/88 HR 41, rr 20, sats 98 on 2l/m, ckmb comes back now as 6.4 and cktot is 279, however tropinin and ri are wnl.  No other complaints, and have medicated for pain and notified Elray Mcgregor PA of above.  She states wait awhile and see if pain meds will help lower the BP and give Hydralazine prn sustained bp, will do repeat cardiac enzymes in AM.  Pt denies ches tpain, sob, nausea, or other heart related s/s at this time will continue to monitor.  ++++ Of note, upon admission pt reports he failed to mention to MD that he takes a supplement at home that is a powder and is suppose to give him more energy it is called "NUIWALLI"  Wife to bring so we can verify name and ingredients+++++

## 2012-07-03 NOTE — ED Notes (Signed)
Report given to Clydie Braun, RN [phone]

## 2012-07-03 NOTE — ED Notes (Signed)
Pt reports dizziness for several days, but worse last night. Pt reports dizziness is intermittent. Pt reports he biked to [employer] and felt increased dizziness, chest pain that is now gone, left neck pain and left shoulder pain. Pt denies shortness of breath or nausea. Pt denies history of the same.

## 2012-07-03 NOTE — ED Notes (Signed)
Report attempted, RN unavailable.

## 2012-07-04 ENCOUNTER — Inpatient Hospital Stay (HOSPITAL_COMMUNITY): Payer: Medicare Other

## 2012-07-04 LAB — CARDIAC PANEL(CRET KIN+CKTOT+MB+TROPI)
CK, MB: 4.9 ng/mL — ABNORMAL HIGH (ref 0.3–4.0)
Relative Index: 2.2 (ref 0.0–2.5)
Total CK: 219 U/L (ref 7–232)
Troponin I: <0.3 ng/mL (ref <0.30)

## 2012-07-04 LAB — CBC
Hemoglobin: 11 g/dL — ABNORMAL LOW (ref 13.0–17.0)
MCH: 28.4 pg (ref 26.0–34.0)
MCHC: 32.9 g/dL (ref 30.0–36.0)
RDW: 14.2 % (ref 11.5–15.5)

## 2012-07-04 LAB — BASIC METABOLIC PANEL
BUN: 16 mg/dL (ref 6–23)
Calcium: 8.6 mg/dL (ref 8.4–10.5)
Creatinine, Ser: 0.91 mg/dL (ref 0.50–1.35)
GFR calc non Af Amer: 85 mL/min — ABNORMAL LOW (ref >90)
Glucose, Bld: 114 mg/dL — ABNORMAL HIGH (ref 70–99)
Potassium: 3.8 mEq/L (ref 3.5–5.1)

## 2012-07-04 LAB — TSH: TSH: 1.526 u[IU]/mL (ref 0.350–4.500)

## 2012-07-04 MED ORDER — PNEUMOCOCCAL VAC POLYVALENT 25 MCG/0.5ML IJ INJ
0.5000 mL | INJECTION | Freq: Once | INTRAMUSCULAR | Status: DC
  Filled 2012-07-04: qty 0.5

## 2012-07-04 MED ORDER — SODIUM CHLORIDE 0.9 % IV BOLUS (SEPSIS)
500.0000 mL | Freq: Once | INTRAVENOUS | Status: AC
  Administered 2012-07-04: 500 mL via INTRAVENOUS

## 2012-07-04 MED ORDER — AMLODIPINE BESYLATE 5 MG PO TABS
5.0000 mg | ORAL_TABLET | Freq: Every day | ORAL | Status: DC
  Administered 2012-07-04 – 2012-07-06 (×3): 5 mg via ORAL
  Filled 2012-07-04 (×3): qty 1

## 2012-07-04 NOTE — Progress Notes (Signed)
Dr. Elisabeth Pigeon at bedside as pt has altered ability to sit up or hold his balance at all. He is oriented x4,slow to respond, good grips, but is totally unable to stand, and if sat up, he fall over in bed. Orders entered will observe closely.

## 2012-07-04 NOTE — Progress Notes (Signed)
Patient ID: Alexander Strickland, male   DOB: 1946-07-25, 66 y.o.   MRN: [id number] [facility] PROGRESS NOTE  Odai Wimmer WUJ:[id number] DOB: [birth year] DOA: 07/03/2012 PCP: No primary provider on file.  Brief narrative: Patient presented with dizziness and chest pain  Assessment/Plan:  Principal Problem:  *Dizziness - light-headed - unclear etiology - MRI brain and CT head non revealing - vestibular evaluation - pending  Active Problems:  Bradycardia - secondary to atenolol - hold atenolol and start norvasc   Hypertension - hydralazine PRN - would start Norvasc 5 mg daily   Chest pain - troponin x 1 negative - this is not concerning for ischemic etiology - chest pain resolved   Anemia, normocytic - hemoglobin stable  Code Status: full code Family Communication: none at bedside Disposition Plan: home when stable  Manson Passey, MD  [facility] Pager [phone]  If 7PM-7AM, please contact night-coverage [email or web address] Password [email or web address] 07/04/2012, 1:10 PM   LOS: 1 day   HPI/Subjective: No acute events.  Objective: Filed Vitals:   07/04/12 0508 07/04/12 1030 07/04/12 1033 07/04/12 1037  BP: 161/86 154/74 157/75 167/87  Pulse: 45 40 44 51  Temp: 97.6 F (36.4 C) 97.9 F (36.6 C)    TempSrc: Oral Oral Oral   Resp: 16 17    Height:      Weight:      SpO2: 98% 96%      Intake/Output Summary (Last 24 hours) at 07/04/12 1310 Last data filed at 07/04/12 1030  Gross per 24 hour  Intake    660 ml  Output    800 ml  Net   -140 ml    Exam:   General:  Pt is alert, follows commands appropriately, not in acute distress  Cardiovascular: Regular rate and rhythm, S1/S2, no murmurs, no rubs, no gallops  Respiratory: Clear to auscultation bilaterally, no wheezing, no crackles, no rhonchi  Abdomen: Soft, non tender, non distended, bowel sounds present, no guarding  Extremities: No edema, pulses DP and PT palpable bilaterally  Neuro: Grossly  nonfocal  Data Reviewed: Basic Metabolic Panel:  Lab 07/04/12 8657 07/03/12 1634  NA 140 145  K 3.8 4.1  CL 108 109  CO2 24 --  GLUCOSE 114* 106*  BUN 16 21  CREATININE 0.91 1.20  CALCIUM 8.6 --  MG -- --  PHOS -- --   CBC:  Lab 07/04/12 0418 07/03/12 1634 07/03/12 1609  WBC 4.3 -- 6.4  NEUTROABS -- -- 3.8  HGB 11.0* 12.6* 11.1*  HCT 33.4* 37.0* 33.6*  MCV 86.1 -- 85.7  PLT 186 -- 218   Cardiac Enzymes:  Lab 07/04/12 0418 07/03/12 2130 07/03/12 1609  CKTOTAL 219 279* --  CKMB 4.9* 6.4* --  CKMBINDEX -- -- --  TROPONINI <0.30 <0.30 <0.30    Studies: Dg Chest 2 View 07/03/2012  *  IMPRESSION: Low lung volumes.  Stable right basilar scarring.  No acute findings.    Ct Head Wo Contrast 07/03/2012  *.  IMPRESSION:  1.  No acute intracranial abnormality. 2.  Chronic small vessel disease.    Mr Brain Wo Contrast 07/04/2012  * IMPRESSION: No acute intracranial findings.  Atrophy with chronic microvascular ischemic change.     Scheduled Meds:   . aspirin EC  325 mg Oral Daily  . enoxaparin (LOVENOX) injection  40 mg Subcutaneous Q24H  . pantoprazole  80 mg Oral Q1200  . pneumococcal 23 valent vaccine  0.5 mL Intramuscular Once  . DISCONTD: omeprazole  40  mg Oral BID AC   Continuous Infusions:   . sodium chloride 75 mL/hr (07/03/12 2300)  . sodium chloride 125 mL/hr at 07/03/12 1644

## 2012-07-05 MED ORDER — MECLIZINE HCL 12.5 MG PO TABS
12.5000 mg | ORAL_TABLET | Freq: Two times a day (BID) | ORAL | Status: DC
  Administered 2012-07-05 – 2012-07-06 (×3): 12.5 mg via ORAL
  Filled 2012-07-05 (×4): qty 1

## 2012-07-05 NOTE — Progress Notes (Signed)
[facility] PROGRESS NOTE  Alexander Strickland ZOX:[id number] DOB: [birth year] DOA: 07/03/2012 PCP: No primary provider on file.  Brief narrative:  Patient presented with dizziness and chest pain   Assessment/Plan:   Principal Problem:  *Dizziness - light-headed  - unclear etiology  - MRI brain and CT head non revealing  - vestibular evaluation - pending  - Per PT - needs outpatient PT follow up  - will start meclizine  Active Problems:  Bradycardia  - secondary to atenolol  - hold atenolol and start norvasc  - appreciate cardiology consult Hypertension  - BP 147/78 - hydralazine PRN  - would start Norvasc 5 mg daily  Chest pain  - troponin x 3 negative  - chest pain resolved  Anemia, normocytic  - hemoglobin stable   Code Status: full code  Family Communication: none at bedside  Disposition Plan: home when stable   Manson Passey, MD  [facility] Pager [phone]  If 7PM-7AM, please contact night-coverage [email or web address] Password [facility] 07/05/2012, 3:19 PM   LOS: 2 days   HPI/Subjective: No acute events overnight. Still feels dizzy.  Objective: Filed Vitals:   07/05/12 0852 07/05/12 1026 07/05/12 1041 07/05/12 1259  BP: 155/88 178/83 178/83 147/78  Pulse: 48 59  49  Temp:    97.9 F (36.6 C)  TempSrc:    Oral  Resp: 18   18  Height:      Weight:      SpO2: 97%   97%    Intake/Output Summary (Last 24 hours) at 07/05/12 1519 Last data filed at 07/05/12 1111  Gross per 24 hour  Intake   5135 ml  Output   4028 ml  Net   1107 ml    Exam:   General:  Pt is alert, follows commands appropriately, not in acute distress  Cardiovascular: Regular rate and rhythm, S1/S2, no murmurs, no rubs, no gallops  Respiratory: Clear to auscultation bilaterally, no wheezing, no crackles, no rhonchi  Abdomen: Soft, non tender, non distended, bowel sounds present, no guarding  Extremities: No edema, pulses DP and PT palpable bilaterally  Neuro:  Grossly nonfocal  Data Reviewed: Basic Metabolic Panel:  Lab 07/04/12 8295 07/03/12 1634  NA 140 145  K 3.8 4.1  CL 108 109  CO2 24 --  GLUCOSE 114* 106*  BUN 16 21  CREATININE 0.91 1.20  CALCIUM 8.6 --  MG -- --  PHOS -- --   CBC:  Lab 07/04/12 0418 07/03/12 1634 07/03/12 1609  WBC 4.3 -- 6.4  NEUTROABS -- -- 3.8  HGB 11.0* 12.6* 11.1*  HCT 33.4* 37.0* 33.6*  MCV 86.1 -- 85.7  PLT 186 -- 218   Cardiac Enzymes:  Lab 07/04/12 1320 07/04/12 0418 07/03/12 2130 07/03/12 1609  CKTOTAL 167 219 279* --  CKMB 4.2* 4.9* 6.4* --  CKMBINDEX -- -- -- --  TROPONINI <0.30 <0.30 <0.30 <0.30    Studies: Dg Chest 2 View  07/03/2012  *RADIOLOGY REPORT*  Clinical Data: Chest pain.  Dyspnea.  Dizziness.  CHEST - 2 VIEW  Comparison: 05/02/2009  Findings: Low lung volumes are noted.  Mild right basilar scarring is unchanged.  No evidence of pulmonary infiltrate or edema.  No evidence of pleural effusion.  Heart size is normal.  No mass or lymphadenopathy identified.  IMPRESSION: Low lung volumes.  Stable right basilar scarring.  No acute findings.  Original Report Authenticated By: Danae Orleans, M.D.   Ct Head Wo Contrast  07/03/2012  *RADIOLOGY REPORT*  Clinical  Data: Worsening dizziness.  CT HEAD WITHOUT CONTRAST  Technique:  Contiguous axial images were obtained from the base of the skull through the vertex without contrast.  Comparison: 01/05/2008  Findings: There is no evidence of intracranial hemorrhage, brain edema or other signs of acute infarction.  There is no evidence of intracranial mass lesion or mass effect.  No abnormal extra-axial fluid collections are identified.  No evidence of hydrocephalus.  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  Chronic small vessel disease again noted.  No skull abnormality identified.  IMPRESSION:  1.  No acute intracranial abnormality. 2.  Chronic small vessel disease.  Original Report  Authenticated By: Danae Orleans, M.D.   Mr Brain Wo Contrast  07/04/2012  *RADIOLOGY REPORT*  Clinical Data: Chest pain and dizziness.  MRI HEAD WITHOUT CONTRAST  Technique:  Multiplanar, multiecho pulse sequences of the brain and surrounding structures were obtained according to standard protocol without intravenous contrast.  Comparison: CT head 07/03/2012 most recent, also 01/05/2008.  Findings: There is no evidence for acute infarction, intracranial hemorrhage, mass lesion, hydrocephalus, or extra-axial fluid.  Mild atrophy and chronic microvascular ischemic change.  Major intracranial vascular structures patent.  Calvarium intact.  Clear sinuses and mastoids.  Tiny left frontal scalp foreign body noted on prior CT external to the calvarium did not cause the patient any discomfort during the scan.  No midline shift. The dorsum sellae is pneumatized, a chronic finding.  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 The pituitary and cerebellar tonsils unremarkable.  Mild chronic sinus disease left maxillary region.  Negative orbits. No mastoid acute fluid.  IMPRESSION: No acute intracranial findings.  Atrophy with chronic microvascular ischemic change.  Original Report Authenticated By: Elsie Stain, M.D.    Scheduled Meds:   . amLODipine  5 mg Oral Daily  . aspirin EC  325 mg Oral Daily  . enoxaparin (LOVENOX) injection  40 mg Subcutaneous Q24H  . meclizine  12.5 mg Oral BID  . pantoprazole  80 mg Oral Q1200  . pneumococcal 23 valent vaccine  0.5 mL Intramuscular Once   Continuous Infusions:   . sodium chloride 75 mL/hr (07/03/12 2300)  . sodium chloride 125 mL/hr at 07/05/12 1456

## 2012-07-05 NOTE — Consult Note (Signed)
Admit date: 07/03/2012 Referring Physician  Dr. Elisabeth Pigeon Primary Physician No primary provider on file. Primary Cardiologist  None Reason for Consultation  bradycardia  HPI: 66 year old male who presented with dizziness as well as chest pain. MRI of the brain unrevealing. He has had bradycardia especially in the night as low as 37. He was on atenolol 25 mg which has been stopped. Norvasc was put in its place for his hypertension.  He does not clearly describe the room spinning or the sensation of movement, he just has gait instability and a feeling of unsteadiness. He has not had any syncope. He does not feel his vision change as though he is having decreased blood flow or near syncope. He does not get diaphoretic. He does note that this sensation can occur in the morning hours after eating his breakfast.? Vagal.  She no longer complains of any chest discomfort. The dizziness was approximately 4 days prior to this admission.  His father died in his 74s from congestive heart failure he states. He is a nonsmoker. He does not have diabetes.    PMH:   Past Medical History  Diagnosis Date  . Hypertension   . Anemia     PSH:   Past Surgical History  Procedure Date  . Knee surgery     Bilateral TKRs  . Shoulder surgery     Left Rotator Cuff Repair   Allergies:  Review of patient's allergies indicates no known allergies. Prior to Admit Meds:   Prescriptions prior to admission  Medication Sig Dispense Refill  . atenolol (TENORMIN) 25 MG tablet Take 25 mg by mouth daily.      Marland Kitchen omeprazole (PRILOSEC OTC) 20 MG tablet Take 20 mg by mouth daily.       Fam HX:    Family History  Problem Relation Age of Onset  . Coronary artery disease Father   . Coronary artery disease Brother   . Coronary artery disease Sister     X 2  . Diabetes type II Father   . Diabetes type II Brother   . Heart failure Mother    Social HX:    History   Social History  . Marital Status: Married    Spouse  Name: N/A    Number of Children: N/A  . Years of Education: N/A   Occupational History  . Not on file.   Social History Main Topics  . Smoking status: Former Games developer  . Smokeless tobacco: Not on file  . Alcohol Use: No     History of ETOH abuse, no drinks x3 years  . Drug Use: No  . Sexually Active:    Other Topics Concern  . Not on file   Social History Narrative  . No narrative on file     ROS:  All 11 ROS were addressed and are negative except what is stated in the HPI  Physical Exam: Blood pressure 147/78, pulse 49, temperature 97.9 F (36.6 C), temperature source Oral, resp. rate 18, height 6' (1.829 m), weight 98.431 kg (217 lb), SpO2 97.00%.    General: Well developed, well nourished, in no acute distress Head: Eyes PERRLA, No xanthomas.   Normal cephalic and atramatic  Lungs:   Clear bilaterally to auscultation and percussion. Normal respiratory effort. No wheezes, no rales. Heart:  Bradycardic regular S1 S2 Pulses are 2+ & equal.   No carotid bruit. No JVD.  No abdominal bruits.  Abdomen: Bowel sounds are positive, abdomen soft and non-tender without masses. No  hepatosplenomegaly. Msk:  Back normal. Normal strength and tone for age. Extremities:   No clubbing, cyanosis or edema.  DP +1 Neuro: Alert and oriented X 3, non-focal, MAE x 4 GU: Deferred Rectal: Deferred Psych:  Good affect, responds appropriately    Labs:   Lab Results  Component Value Date   WBC 4.3 07/04/2012   HGB 11.0* 07/04/2012   HCT 33.4* 07/04/2012   MCV 86.1 07/04/2012   PLT 186 07/04/2012    Lab 07/04/12 0418  NA 140  K 3.8  CL 108  CO2 24  BUN 16  CREATININE 0.91  CALCIUM 8.6  PROT --  BILITOT --  ALKPHOS --  ALT --  AST --  GLUCOSE 114*   No results found for this basename: PTT   Lab Results  Component Value Date   INR 1.3 05/10/2009   INR 1.2 05/09/2009   INR 1.1 05/08/2009   Lab Results  Component Value Date   CKTOTAL 167 07/04/2012   CKMB 4.2* 07/04/2012    TROPONINI <0.30 07/04/2012        Radiology:  Ct Head Wo Contrast  07/03/2012  *RADIOLOGY REPORT*  Clinical Data: Worsening dizziness.  CT HEAD WITHOUT CONTRAST  Technique:  Contiguous axial images were obtained from the base of the skull through the vertex without contrast.  Comparison: 01/05/2008  Findings: There is no evidence of intracranial hemorrhage, brain edema or other signs of acute infarction.  There is no evidence of intracranial mass lesion or mass effect.  No abnormal extra-axial fluid collections are identified.  No evidence of hydrocephalus.  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  Chronic small vessel disease again noted.  No skull abnormality identified.  IMPRESSION:  1.  No acute intracranial abnormality. 2.  Chronic small vessel disease.  Original Report Authenticated By: Danae Orleans, M.D.   Mr Brain Wo Contrast  07/04/2012  *RADIOLOGY REPORT*  Clinical Data: Chest pain and dizziness.  MRI HEAD WITHOUT CONTRAST  Technique:  Multiplanar, multiecho pulse sequences of the brain and surrounding structures were obtained according to standard protocol without intravenous contrast.  Comparison: CT head 07/03/2012 most recent, also 01/05/2008.  Findings: There is no evidence for acute infarction, intracranial hemorrhage, mass lesion, hydrocephalus, or extra-axial fluid.  Mild atrophy and chronic microvascular ischemic change.  Major intracranial vascular structures patent.  Calvarium intact.  Clear sinuses and mastoids.  Tiny left frontal scalp foreign body noted on prior CT external to the calvarium did not cause the patient any discomfort during the scan.  No midline shift. The dorsum sellae is pneumatized, a chronic finding.  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 The pituitary and cerebellar tonsils unremarkable.  Mild chronic sinus disease left maxillary  region.  Negative orbits. No mastoid acute fluid.  IMPRESSION: No acute intracranial findings.  Atrophy with chronic microvascular ischemic change.  Original Report Authenticated By: Elsie Stain, M.D.    EKG:  Sinus bradycardia rate 37, subtle J-point elevation diffusely, possible LVH. Telemetry also personally viewed. Bradycardia of approximately 37 at 5 AM. Sleeping. During wakeful hours, heart rate increases only to the 60s. Likely chronotropic incompetence. Personally viewed.  Echocardiogram pending.  ASSESSMENT/PLAN:    66 year old with approximately one-week history of worsening intermittent dizziness. Bradycardia is noted transiently throughout the day as well as night. No significant pauses. No syncope.  1. Bradycardia - based upon his history, telemetry  monitoring, his bradycardia may be playing a role in his symptoms. He does not clearly demonstrate vertigo-like symptoms, i.e. the room is not spinning for instance. The situations do occur for instance after eating breakfast at times. Perhaps there is a vagal component causing a vasodepressor/chronotropic depression. I do agree with discontinuation of his atenolol 25 mg. He does demonstrate bradycardia however it would be nice to see how he does with ambulation. I would encourage ambulating him around the hallway and tracking his heart rate. He states that at home, when riding his bicycle for instance, he is able to work through his symptoms.  I will check an echocardiogram to ensure proper structure and function. His TSH is normal. Neurologic workup thus far with MRI unremarkable.  We may end up placing him on an event monitor for 30 days post hospital to see if we can reduplicate symptoms/bradycardia causing symptoms.   We will follow along with you.  Donato Schultz, MD  07/05/2012  5:09 PM

## 2012-07-05 NOTE — Progress Notes (Signed)
   CARE MANAGEMENT NOTE 07/05/2012  Patient:  Alexander Strickland,Alexander Strickland   Account Number:  [id number]  Date Initiated:  07/05/2012  Documentation initiated by:  Jiles Crocker  Subjective/Objective Assessment:   ADMITTED WITH CHEST PAIN     Action/Plan:   INDEPENDENT PRIOR TO ADMISSION   Anticipated DC Date:  07/06/2012   Anticipated DC Plan:  HOME/SELF CARE      DC Planning Services  CM consult               Status of service:  In process, will continue to follow Medicare Important Message given?  NA - LOS <3 / Initial given by admissions (If response is "NO", the following Medicare IM given date fields will be blank)  Per UR Regulation:  Reviewed for med. necessity/level of care/duration of stay  Comments:  07/05/2012- B Nyzir Dubois RN, BSN, MHA

## 2012-07-05 NOTE — Evaluation (Signed)
Occupational Therapy Evaluation Patient Details Name: Alexander Strickland MRN: [id number] DOB: [birth year] Today's Date: 07/05/2012 Time: 7829-5621 OT Time Calculation (min): 32 min  OT Assessment / Plan / Recommendation Clinical Impression  This 66 year old man was admitted with dizziness/lightheadness and near fall.  A vestibular eval was completed and no definitive disorder was identified; however pt is unsteady both in standing and with ambulation and would benefit from continued OT in acute.      OT Assessment  Patient needs continued OT Services    Follow Up Recommendations  Supervision/Assistance - 24 hour (OP PT--[facility])    Barriers to Discharge      Equipment Recommendations  Other (comment) (recommend shower seat--pt may want to get through [place])    Recommendations for Other Services    Frequency  Min 2X/week    Precautions / Restrictions Precautions Precautions: Fall Restrictions Weight Bearing Restrictions: No   Pertinent Vitals/Pain Headache right side. Orthostatics:  Supine 181/82 sit 195/95 stand 178/89    ADL  Eating/Feeding: Simulated;Independent Where Assessed - Eating/Feeding: Chair Grooming: Simulated;Set up Where Assessed - Grooming: Supported sitting Upper Body Bathing: Simulated;Set up Where Assessed - Upper Body Bathing: Supported sitting Lower Body Bathing: Simulated;Min guard Where Assessed - Lower Body Bathing: Supported sit to stand Upper Body Dressing: Simulated;Set up Where Assessed - Upper Body Dressing: Supported sitting Lower Body Dressing: Simulated;Min guard Where Assessed - Lower Body Dressing: Supported sit to Pharmacist, hospital: Simulated;Minimal assistance (ambulating in room and back to bed) Toileting - Clothing Manipulation and Hygiene: Simulated;Min guard Where Assessed - Toileting Clothing Manipulation and Hygiene: Sit to stand from 3-in-1 or toilet Equipment Used: Gait belt Transfers/Ambulation Related to ADLs: cotx  with PT.  Pt off balance and felt woozy--lightheaded.  Pt unsteady but did not lose balance.  c/o R hip problem, and hurt L elbow in fall 2 weeks ago--sore.  Wife will be home with him:  educated that he needs her to walk with him and guard him and demonstrated how to guard/steady.   ADL Comments: Min guard due to unsteadiness.  Performed vestibular eval:  did not have symptoms of BPPV so deferred.  Checked for hypofunction/bilateral hypofunction and did not identify vestibular problem.  Pt wears bifocals and did not have them here with him:pt blinked  but did not refixate with head thrust to left.  c/o right sided neck pain but ROM is WFLS.  Pt leans slightly to left.  Has a problem with L hip and was unsteady walking.  Pt reports harder to walk when turning head to left.   OT Diagnosis: Generalized weakness  OT Problem List: Decreased activity tolerance;Pain;Impaired balance (sitting and/or standing);Decreased strength OT Treatment Interventions: Self-care/ADL training;Balance training;Patient/family education;Therapeutic activities   OT Goals Acute Rehab OT Goals OT Goal Formulation: With patient Time For Goal Achievement: 07/19/12 Potential to Achieve Goals: Good ADL Goals Pt Will Transfer to Toilet: with supervision;Ambulation;Regular height toilet ADL Goal: Toilet Transfer - Progress: Goal set today Pt Will Perform Tub/Shower Transfer: Shower transfer;Ambulation;with supervision;Shower seat with back ADL Goal: Web designer - Progress: Goal set today Miscellaneous OT Goals Miscellaneous OT Goal #1: Pt will gather clothes at supervision level OT Goal: Miscellaneous Goal #1 - Progress: Goal set today Miscellaneous OT Goal #2: Pt will be able to turn head to both sides while ambulating in room for adls without loss of balance  Visit Information  Last OT Received On: 07/05/12 Assistance Needed: +1 PT/OT Co-Evaluation/Treatment: Yes    Subjective Data  Subjective: "I felt good  until I ate   Prior Functioning  Vision/Perception  Home Living Lives With: Spouse Available Help at Discharge: Family Type of Home: House Home Access: Level entry Home Layout: One level Bathroom Shower/Tub: Health visitor: Standard Home Adaptive Equipment: Straight cane;Walker - rolling (doesn't use) Prior Function Level of Independence: Independent Driving:  (rode bike distances) Communication Communication: No difficulties   Vision - Assessment Eye Alignment: Within Functional Limits  Cognition  Overall Cognitive Status: Appears within functional limits for tasks assessed/performed Arousal/Alertness: Awake/alert Orientation Level: Oriented X4 / Intact Behavior During Session: [facility] for tasks performed    Extremity/Trunk Assessment Right Upper Extremity Assessment RUE ROM/Strength/Tone: Within functional levels Left Upper Extremity Assessment LUE ROM/Strength/Tone: Within functional levels Trunk Assessment Trunk Assessment: Other exceptions (slight lateral lean to left with head tilted slightly to lef)   Mobility Transfers Transfers: Sit to Stand Sit to Stand: 4: Min guard   Exercise    Balance Balance Balance Assessed:  (pt made quick movements sitting unsupported--intentional.  S)  End of Session OT - End of Session Equipment Utilized During Treatment: Gait belt Activity Tolerance: Patient tolerated treatment well (had Headache) Patient left: in bed;with call bell/phone within reach;with bed alarm set  GO     Jewelz Ricklefs 07/05/2012, 10:13 AM Marica Otter, OTR/L [phone] 07/05/2012

## 2012-07-05 NOTE — Evaluation (Signed)
Physical Therapy Evaluation Patient Details Name: Alexander Strickland MRN: [id number] DOB: 04/03/1946 Today's Date: 07/05/2012 Time: 4098-1191 PT Time Calculation (min): 32 min  PT Assessment / Plan / Recommendation Clinical Impression  Pt presents with dizziness/lightheadedness with history of L neck and shoulder pain from previous L rotator cuff surgery.  Tolerated ambulation in hallway without AD at min assist (intermittent mod with head turns) with noted imbalances and instability.  Pt states he notices lightheadedness following meals.  Pt will benefit from skilled PT in acute venue to address deficits.  PT recommends outpatient PT for balance training at D/C to return pt to PLOF.      PT Assessment  Patient needs continued PT services    Follow Up Recommendations  Outpatient PT    Barriers to Discharge None      Equipment Recommendations  None recommended by PT    Recommendations for Other Services     Frequency Min 3X/week    Precautions / Restrictions Precautions Precautions: Fall Restrictions Weight Bearing Restrictions: No   Pertinent Vitals/Pain Some pain noted with head turns to left when ambulating.       Mobility  Bed Mobility Bed Mobility: Supine to Sit;Sit to Supine Supine to Sit: 5: Supervision;HOB flat Sit to Supine: 5: Supervision;HOB flat Details for Bed Mobility Assistance: Supervision for safety.  Transfers Transfers: Sit to Stand;Stand to Sit Sit to Stand: 4: Min guard;With upper extremity assist;From elevated surface;From bed Stand to Sit: 4: Min guard;With upper extremity assist;To bed;To elevated surface Details for Transfer Assistance: Min/guard for safety and steadying due to noted posterior lean with weight on heels.  Cues for safety and hand placement.  Ambulation/Gait Ambulation/Gait Assistance: 4: Min assist Ambulation Distance (Feet): 60 Feet Assistive device: None Ambulation/Gait Assistance Details: Pt noted to be unsteady with gait, esp with  head turns (L >R).  Cues for safety and keeping eyes open while ambulating.  Gait Pattern: Step-through pattern;Antalgic;Ataxic General Gait Details: pt somewhat antalgic due to left hip pain, ataxic at times, esp with head turns.  Stairs: No Wheelchair Mobility Wheelchair Mobility: No    Exercises     PT Diagnosis: Abnormality of gait;Acute pain  PT Problem List: Decreased balance;Decreased mobility;Decreased coordination;Pain PT Treatment Interventions: DME instruction;Gait training;Functional mobility training;Therapeutic activities;Balance training;Therapeutic exercise;Patient/family education   PT Goals Acute Rehab PT Goals PT Goal Formulation: With patient Time For Goal Achievement: 07/12/12 Potential to Achieve Goals: Good Pt will go Sit to Stand: Independently PT Goal: Sit to Stand - Progress: Goal set today Pt will go Stand to Sit: Independently PT Goal: Stand to Sit - Progress: Goal set today Pt will Ambulate: 51 - 150 feet;with supervision;with least restrictive assistive device PT Goal: Ambulate - Progress: Goal set today Pt will Perform Home Exercise Program: with supervision, verbal cues required/provided PT Goal: Perform Home Exercise Program - Progress: Goal set today Additional Goals Additional Goal #1: Perform high level balance activities (head turns, tandem, etc) at supervision level in order to decrease risk of falling.  PT Goal: Additional Goal #1 - Progress: Goal set today  Visit Information  Last PT Received On: 07/05/12 Assistance Needed: +1 PT/OT Co-Evaluation/Treatment: Yes    Subjective Data  Subjective: I got lightheaded right after I ate.  Patient Stated Goal: to get back home   Prior Functioning  Home Living Lives With: Spouse Available Help at Discharge: Family Type of Home: House Home Access: Level entry Home Layout: One level Bathroom Shower/Tub: Health visitor: Standard Home Adaptive Equipment: Straight  cane;Walker -  rolling Additional Comments: Doesnt use AD, from previous sx's.  Prior Function Level of Independence: Independent Driving: No (rides bike) Communication Communication: No difficulties    Cognition  Overall Cognitive Status: Appears within functional limits for tasks assessed/performed Arousal/Alertness: Awake/alert Orientation Level: Oriented X4 / Intact Behavior During Session: [facility] for tasks performed    Extremity/Trunk Assessment Right Upper Extremity Assessment RUE ROM/Strength/Tone: Within functional levels Left Upper Extremity Assessment LUE ROM/Strength/Tone: Within functional levels Right Lower Extremity Assessment RLE ROM/Strength/Tone: WFL for tasks assessed RLE Coordination: WFL - gross motor Left Lower Extremity Assessment LLE ROM/Strength/Tone: WFL for tasks assessed LLE Coordination: WFL - gross motor Trunk Assessment Trunk Assessment: Other exceptions Trunk Exceptions: Pt with left lateral head tilt also with slight left facial assymmetry.    Balance Balance Balance Assessed:  (pt made quick movements sitting unsupported--intentional.  S) High Level Balance High Level Balance Activites: Head turns High Level Balance Comments: Pt requires min/mod assist with head turns (L>R) during ambulation, with pt stating this increases the lightheadedness and causes some neck pain.   End of Session PT - End of Session Equipment Utilized During Treatment: Gait belt Activity Tolerance: Other (comment) (limited by some lightheadedness) Patient left: in bed;with call bell/phone within reach;with bed alarm set Nurse Communication: Mobility status  GP     Page, Meribeth Mattes 07/05/2012, 10:46 AM

## 2012-07-06 DIAGNOSIS — R42 Dizziness and giddiness: Secondary | ICD-10-CM

## 2012-07-06 MED ORDER — MECLIZINE HCL 12.5 MG PO TABS: 12.5000 mg | ORAL_TABLET | Freq: Two times a day (BID) | ORAL | Status: DC | PRN

## 2012-07-06 MED ORDER — AMLODIPINE BESYLATE 5 MG PO TABS: 5.0000 mg | ORAL_TABLET | Freq: Every day | ORAL | Status: DC

## 2012-07-06 NOTE — Progress Notes (Signed)
Physical Therapy Treatment Patient Details Name: Alexander Strickland MRN: [id number] DOB: [birth year] Today's Date: 07/06/2012 Time: 4098-1191 PT Time Calculation (min): 8 min  PT Assessment / Plan / Recommendation Comments on Treatment Session  Pt progressing well with mobility. He ambulated 200' without LOB, no assistive device. Pt reported he wasn't dizzy, just felt a light "buzz" in his head. OK to DC home from PT standpoint.    Follow Up Recommendations  Outpatient PT    Barriers to Discharge        Equipment Recommendations  None recommended by PT    Recommendations for Other Services    Frequency Min 3X/week   Plan      Precautions / Restrictions Precautions Precautions: Fall Restrictions Weight Bearing Restrictions: No   Pertinent Vitals/Pain *pt denies pain**    Mobility  Bed Mobility Supine to Sit: 5: Supervision;HOB flat Transfers Sit to Stand: 7: Independent;From chair/3-in-1 Stand to Sit: 7: Independent;To chair/3-in-1 Ambulation/Gait Ambulation/Gait Assistance: 7: Independent Ambulation Distance (Feet): 200 Feet Assistive device: None Ambulation/Gait Assistance Details: no LOB Gait Pattern: Within Functional Limits    Exercises     PT Diagnosis:    PT Problem List:   PT Treatment Interventions:     PT Goals Acute Rehab PT Goals PT Goal Formulation: With patient Time For Goal Achievement: 07/12/12 Potential to Achieve Goals: Good Pt will go Sit to Stand: Independently PT Goal: Sit to Stand - Progress: Met Pt will go Stand to Sit: Independently PT Goal: Stand to Sit - Progress: Met Pt will Ambulate: 51 - 150 feet;with supervision;with least restrictive assistive device PT Goal: Ambulate - Progress: Met Pt will Perform Home Exercise Program: with supervision, verbal cues required/provided Additional Goals Additional Goal #1: Perform high level balance activities (head turns, tandem, etc) at supervision level in order to decrease risk of falling.    Visit Information  Last PT Received On: 07/06/12 Assistance Needed: +1    Subjective Data  Subjective: I'm not dizzy, just a little buzz.  It takes work to keep my balance but I don't feel like I'm going to fall.  [name]   Cognition  Overall Cognitive Status: Appears within functional limits for tasks assessed/performed Arousal/Alertness: Awake/alert Behavior During Session: [name] for tasks performed    Balance     End of Session PT - End of Session Activity Tolerance: Patient tolerated treatment well (limited by some lightheadedness) Patient left: with call bell/phone within reach;in chair Nurse Communication: Mobility status   GP     [name] 07/06/2012, 11:18 AM [phone]

## 2012-07-06 NOTE — Discharge Summary (Signed)
Physician Discharge Summary  Alexander Strickland ZOX:[id number] DOB: 1946/09/13 DOA: 07/03/2012  PCP: No primary provider on file.  Admit date: 07/03/2012 Discharge date: 07/06/2012  Recommendations for Outpatient Follow-up:  1. Please follow up with cardiology per scheduled appointment and for ECHO  Discharge Diagnoses:  Principal Problem:  *Dizziness Active Problems:  Bradycardia secondary to beta blocker  Hypertension  Chest pain, musculoskeletal  Anemia, normocytic  Discharge Condition: medically stable for discharge home today, outpatient follow up PT for balance training ordered  Diet recommendation: as tolerated heart healthy  History of present illness:   Patient presented with dizziness which was present for about 1 month prior to admission. Patient today reported not actually feeling light headed as he mentioned at the time of admission but just feeling "little out of balance". Patient also shared with me that this issue with dizziness started actually shortly after he ordered some herbal supplement from Internet (he did not share the name of supplement nor for what is it used). I have strongly encouraged hime not to use this supplement as it is not (and he confirmed it) FDA approved. I also told him to stop taking atenolol and start taking Norvasc as his BP was very well controlled with Norvasc while in hospital.  Assessment/Plan:   Principal Problem:  *Dizziness - unclear etiology  - MRI brain and CT head non revealing  - vestibular evaluation - outpatient PT for balance training - Per PT - needs outpatient PT - order placed - will start meclizine 12.5 mg BID PRN; patient reported it did help him with balance  Active Problems:  Bradycardia  - secondary to atenolol (HR 49-59), asymtomatic - hold atenolol and started norvasc  - appreciate cardiology consult - recommended outpatient follow up and ECHO; no Holter monitor indicated at this time  Hypertension  - BP 139/75  today - Norvasc 5 mg daily to be continued on discharge  Chest pain  - likely musculoskeletal - troponin x 3 negative  - chest pain resolved  Anemia, normocytic  - hemoglobin stable   Code Status: full code  Family Communication: none at bedside  Disposition Plan: home today  Procedures:  None  Consultations:  Cardiology  Discharge Exam: Filed Vitals:   07/06/12 0457  BP: 139/75  Pulse: 46  Temp: 97.7 F (36.5 C)  Resp: 18   Filed Vitals:   07/05/12 1041 07/05/12 1259 07/05/12 2042 07/06/12 0457  BP: 178/83 147/78 144/70 139/75  Pulse:  49 47 46  Temp:  97.9 F (36.6 C) 98.2 F (36.8 C) 97.7 F (36.5 C)  TempSrc:  Oral Oral Oral  Resp:  18 18 18   Height:      Weight:      SpO2:  97% 97% 100%    General: Pt is alert, follows commands appropriately, not in acute distress Cardiovascular: Regular rate and rhythm, S1/S2 +, no murmurs, no rubs, no gallops Respiratory: Clear to auscultation bilaterally, no wheezing, no crackles, no rhonchi Abdominal: Soft, non tender, non distended, bowel sounds +, no guarding Extremities: no edema, no cyanosis, pulses palpable bilaterally DP and PT Neuro: Grossly nonfocal  Discharge Instructions  Discharge Orders    Future Orders Please Complete By Expires   Diet - low sodium heart healthy      Increase activity slowly      Call MD for:  persistant nausea and vomiting      Call MD for:  severe uncontrolled pain      Call MD for:  difficulty  breathing, headache or visual disturbances      Call MD for:  persistant dizziness or light-headedness      Discharge instructions      Comments:   Please follow up with cardiology per scheduled appointment     Medication List  As of 07/06/2012 10:25 AM   STOP taking these medications         atenolol 25 MG tablet         TAKE these medications         amLODipine 5 MG tablet   Commonly known as: NORVASC   Take 1 tablet (5 mg total) by mouth daily.      meclizine 12.5 MG  tablet   Commonly known as: ANTIVERT   Take 1 tablet (12.5 mg total) by mouth 2 (two) times daily as needed.      omeprazole 20 MG tablet   Commonly known as: PRILOSEC OTC   Take 20 mg by mouth daily.           Follow-up Information    Follow up with FERGUSON,CYNTHIA A, NP on 07/14/2012. (ECHO at 9:45am)    Contact information:   [facility] [address], [address] [place] [address] [phone]           The results of significant diagnostics from this hospitalization (including imaging, microbiology, ancillary and laboratory) are listed below for reference.    Significant Diagnostic Studies: Dg Chest 2 View  07/03/2012  *RADIOLOGY REPORT*  Clinical Data: Chest pain.  Dyspnea.  Dizziness.  CHEST - 2 VIEW  Comparison: 05/02/2009  Findings: Low lung volumes are noted.  Mild right basilar scarring is unchanged.  No evidence of pulmonary infiltrate or edema.  No evidence of pleural effusion.  Heart size is normal.  No mass or lymphadenopathy identified.  IMPRESSION: Low lung volumes.  Stable right basilar scarring.  No acute findings.  Original Report Authenticated By: Danae Orleans, M.D.   Ct Head Wo Contrast  07/03/2012  *RADIOLOGY REPORT*  Clinical Data: Worsening dizziness.  CT HEAD WITHOUT CONTRAST  Technique:  Contiguous axial images were obtained from the base of the skull through the vertex without contrast.  Comparison: 01/05/2008  Findings: There is no evidence of intracranial hemorrhage, brain edema or other signs of acute infarction.  There is no evidence of intracranial mass lesion or mass effect.  No abnormal extra-axial fluid collections are identified.  No evidence of hydrocephalus.  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  Chronic small vessel disease again noted.  No skull abnormality identified.  IMPRESSION:  1.  No acute intracranial abnormality. 2.   Chronic small vessel disease.  Original Report Authenticated By: Danae Orleans, M.D.   Mr Brain Wo Contrast  07/04/2012  *RADIOLOGY REPORT*  Clinical Data: Chest pain and dizziness.  MRI HEAD WITHOUT CONTRAST  Technique:  Multiplanar, multiecho pulse sequences of the brain and surrounding structures were obtained according to standard protocol without intravenous contrast.  Comparison: CT head 07/03/2012 most recent, also 01/05/2008.  Findings: There is no evidence for acute infarction, intracranial hemorrhage, mass lesion, hydrocephalus, or extra-axial fluid.  Mild atrophy and chronic microvascular ischemic change.  Major intracranial vascular structures patent.  Calvarium intact.  Clear sinuses and mastoids.  Tiny left frontal scalp foreign body noted on prior CT external to the calvarium did not cause the patient any discomfort during the scan.  No midline shift. 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 The pituitary and cerebellar tonsils unremarkable.  Mild chronic sinus disease left maxillary region.  Negative orbits. No mastoid acute fluid.  IMPRESSION: No acute intracranial findings.  Atrophy with chronic microvascular ischemic change.  Original Report Authenticated By: Elsie Stain, M.D.    Microbiology: No results found for this or any previous visit (from the past 240 hour(s)).   Labs: Basic Metabolic Panel:  Lab 07/04/12 1610 07/03/12 1634  NA 140 145  K 3.8 4.1  CL 108 109  CO2 24 --  GLUCOSE 114* 106*  BUN 16 21  CREATININE 0.91 1.20  CALCIUM 8.6 --  MG -- --  PHOS -- --   Liver Function Tests: No results found for this basename: AST:5,ALT:5,ALKPHOS:5,BILITOT:5,PROT:5,ALBUMIN:5 in the last 168 hours No results found for this basename: LIPASE:5,AMYLASE:5 in the last 168 hours No results found for this basename: AMMONIA:5 in the last 168 hours CBC:  Lab  07/04/12 0418 07/03/12 1634 07/03/12 1609  WBC 4.3 -- 6.4  NEUTROABS -- -- 3.8  HGB 11.0* 12.6* 11.1*  HCT 33.4* 37.0* 33.6*  MCV 86.1 -- 85.7  PLT 186 -- 218   Cardiac Enzymes:  Lab 07/04/12 1320 07/04/12 0418 07/03/12 2130 07/03/12 1609  CKTOTAL 167 219 279* --  CKMB 4.2* 4.9* 6.4* --  CKMBINDEX -- -- -- --  TROPONINI <0.30 <0.30 <0.30 <0.30   BNP: BNP (last 3 results) No results found for this basename: PROBNP:3 in the last 8760 hours CBG: No results found for this basename: GLUCAP:5 in the last 168 hours  Time coordinating discharge: Over 30 minutes  Signed:  Manson Passey, MD  [facility] 07/06/2012, 10:25 AM  Pager #: [phone]

## 2012-07-06 NOTE — Progress Notes (Signed)
Subjective:  Ate this am and felt pretty well after (usually feels dizzy). Denies any shortness of breath, chest pain, syncope. Telemetry demonstrates sinus bradycardia rate in the 50s currently. A properly 48 hours ago he was having sustained heart rates in the upper 30s. Discontinued atenolol 25 mg.   Objective:  Vital Signs in the last 24 hours: Temp:  [97.7 F (36.5 C)-98.2 F (36.8 C)] 97.7 F (36.5 C) (07/16 0457) Pulse Rate:  [46-59] 46  (07/16 0457) Resp:  [18] 18  (07/16 0457) BP: (139-178)/(70-88) 139/75 mmHg (07/16 0457) SpO2:  [97 %-100 %] 100 % (07/16 0457)  Intake/Output from previous day: 07/15 0701 - 07/16 0700 In: 2665 [P.O.:600; I.V.:2065] Out: 2700 [Urine:2700]   Physical Exam: General: Well developed, well nourished, in no acute distress. Head:  Normocephalic and atraumatic. Lungs: Clear to auscultation and percussion. Heart:  bradycardic, regular rhythm.  No murmur, rubs or gallops.  Abdomen: soft, non-tender, positive bowel sounds. Extremities: No clubbing or cyanosis. No edema. Neurologic: Alert and oriented x 3.    Lab Results:  Basename 07/04/12 0418 07/03/12 1634 07/03/12 1609  WBC 4.3 -- 6.4  HGB 11.0* 12.6* --  PLT 186 -- 218    Basename 07/04/12 0418 07/03/12 1634  NA 140 145  K 3.8 4.1  CL 108 109  CO2 24 --  GLUCOSE 114* 106*  BUN 16 21  CREATININE 0.91 1.20    Basename 07/04/12 1320 07/04/12 0418  TROPONINI <0.30 <0.30     Telemetry:  As above. Personally viewed.   Assessment/Plan:  Principal Problem:  *Dizziness - light-headed Active Problems:  Bradycardia  Hypertension  Chest pain  Anemia   Bradycardia - improved off of atenolol. Dizziness seems to have improved this am. No indication for pacemaker. Ambulate. OK for DC from cardiac standpoint. I do not think he needs an event monitor at this point. I will have him follow up with me. He may obtain echo as outpt. No signs of heart failure. Appt set up for next Wed (ECHO  as well).   Hypertension   per primary team. New start amlodipine 5mg .   Cait Locust 07/06/2012, 8:20 AM

## 2012-07-06 NOTE — Progress Notes (Signed)
VASCULAR LAB PRELIMINARY  PRELIMINARY  PRELIMINARY  PRELIMINARY  Carotid duplex  completed.    Preliminary report:  Bilateral:  No evidence of hemodynamically significant internal carotid artery stenosis.   Vertebral artery flow is antegrade.      Alexander Strickland, RVT 07/06/2012, 9:18 AM

## 2012-07-06 NOTE — Progress Notes (Signed)
Occupational Therapy Treatment Patient Details Name: Alexander Strickland MRN: [id number] DOB: 1946/01/31 Today's Date: 07/06/2012 Time: 5621-3086 OT Time Calculation (min): 12 min  OT Assessment / Plan / Recommendation Comments on Treatment Session      Follow Up Recommendations  Supervision/Assistance - 24 hour    Barriers to Discharge       Equipment Recommendations  Toilet riser;Tub/shower seat;Other (comment) (Pt may be able to get through [place])    Recommendations for Other Services    Frequency     Plan      Precautions / Restrictions Precautions Precautions: Fall Restrictions Weight Bearing Restrictions: No   Pertinent Vitals/Pain Developed headache--was going to request meds.  R hip sore also    ADL  Toilet Transfer: Performed;Min guard Toilet Transfer Method:  (ambulate) Acupuncturist: Comfort height toilet;Grab bars Tub/Shower Transfer: Performed;Min guard Web designer Method: Science writer: Walk in shower Transfers/Ambulation Related to ADLs: Pt had no c/o lightheadedness/dizziness during OT but he did develop headache L side of head.  1 LOB but pt recovered himself.  Still cannot walk and turn head well together ADL Comments: retrieved clothes in room with min guard.  Simulated reaching to feet in shower.  Discussed elevated toilet seat (because he said 3:1 won't fit)     OT Diagnosis:    OT Problem List:   OT Treatment Interventions:     OT Goals ADL Goals Pt Will Transfer to Toilet: with supervision;Ambulation;Regular height toilet ADL Goal: Toilet Transfer - Progress: Progressing toward goals Pt Will Perform Tub/Shower Transfer: Shower transfer;Ambulation;with supervision;Shower seat with back ADL Goal: Web designer - Progress: Progressing toward goals Miscellaneous OT Goals Miscellaneous OT Goal #1: Pt will gather clothes at supervision level OT Goal: Miscellaneous Goal #1 - Progress: Progressing toward  goals Miscellaneous OT Goal #2: Pt will be able to turn head to both sides while ambulating in room for adls without loss of balance OT Goal: Miscellaneous Goal #2 - Progress: Progressing toward goals  Visit Information  Last OT Received On: 07/06/12 Assistance Needed: +1    Subjective Data      Prior Functioning       Cognition  Overall Cognitive Status: Appears within functional limits for tasks assessed/performed Behavior During Session: [facility] for tasks performed    Mobility Bed Mobility Supine to Sit: 5: Supervision;HOB flat Transfers Sit to Stand: 5: Supervision   Exercises    Balance    End of Session OT - End of Session Equipment Utilized During Treatment: Gait belt Activity Tolerance: Patient limited by pain Patient left: in bed;with call bell/phone within reach;with bed alarm set Nurse Communication:  (pt was going to request pain meds; MD arrived)  GO     Kelbie Moro 07/06/2012, 9:58 AM Marica Otter, OTR/L [phone] 07/06/2012

## 2012-07-06 NOTE — Progress Notes (Signed)
Noted a request to arrange for outpatient physical therapy but patient was discharged home prior to [facility] consulting with the patient about outpatient physical therapy; Attending MD paged, need order to state Outpatient physical therapy/ eval and treat; CM will try to call the patient at home for arrangements; B Ave Filter RN, BSN, [place]

## 2012-07-07 NOTE — Progress Notes (Signed)
Telephone call to Mr Alexander Strickland at home to offer outpatient physical therapy/ shower chair choices; Mr Alexander Strickland stated " I dont need that, I am going to the [place] and they will take care of everything". Abelino Derrick RN, BSN, [place].

## 2012-07-21 NOTE — Progress Notes (Addendum)
07/05/12 1045  PT G-Codes **NOT FOR INPATIENT CLASS**  Functional Assessment Tool Used clinical judgemetn with chart review  Functional Limitation Mobility: Walking and moving around  Mobility: Walking and Moving Around Current Status [phone]) CI  Mobility: Walking and Moving Around Goal Status [phone]) CH    late entry for 07/05/2012 ,valuation performed by Clovia Cuff, PT as as reviewed by assessment and clinical judgement in chart documentation the above G-codes were added. Marella Bile, PT Pager: [phone] 07/21/2012

## 2012-07-21 NOTE — Progress Notes (Addendum)
07/05/12 1021  OT G-codes **NOT FOR INPATIENT CLASS**  Functional Assessment Tool Used clinical judgement by chart review  Functional Limitation Self care  Self Care Current Status (J4782) CJ  Self Care Goal Status (N5621) CI    Eval performed 07/05/12 by Marica Otter. Chart reviewed and G codes added.  08/02/12 Cipriano Mile OTR/L Pager [phone] Office [phone]

## 2015-07-24 ENCOUNTER — Emergency Department (HOSPITAL_COMMUNITY): Payer: Medicare Other

## 2015-07-24 ENCOUNTER — Encounter (HOSPITAL_COMMUNITY): Payer: Self-pay | Admitting: Emergency Medicine

## 2015-07-24 ENCOUNTER — Emergency Department (HOSPITAL_COMMUNITY)
Admission: EM | Admit: 2015-07-24 | Discharge: 2015-07-24 | Disposition: A | Discharge status: Home / Self Care | Payer: Medicare Other | Attending: Emergency Medicine | Admitting: Emergency Medicine

## 2015-07-24 DIAGNOSIS — Z87891 Personal history of nicotine dependence: Secondary | ICD-10-CM | POA: Diagnosis not present

## 2015-07-24 DIAGNOSIS — S6992XA Unspecified injury of left wrist, hand and finger(s), initial encounter: Secondary | ICD-10-CM

## 2015-07-24 DIAGNOSIS — W293XXA Contact with powered garden and outdoor hand tools and machinery, initial encounter: Secondary | ICD-10-CM | POA: Diagnosis not present

## 2015-07-24 DIAGNOSIS — Y9389 Activity, other specified: Secondary | ICD-10-CM | POA: Insufficient documentation

## 2015-07-24 DIAGNOSIS — Z862 Personal history of diseases of the blood and blood-forming organs and certain disorders involving the immune mechanism: Secondary | ICD-10-CM | POA: Diagnosis not present

## 2015-07-24 DIAGNOSIS — Z79899 Other long term (current) drug therapy: Secondary | ICD-10-CM | POA: Insufficient documentation

## 2015-07-24 DIAGNOSIS — I10 Essential (primary) hypertension: Secondary | ICD-10-CM | POA: Insufficient documentation

## 2015-07-24 DIAGNOSIS — Y998 Other external cause status: Secondary | ICD-10-CM | POA: Insufficient documentation

## 2015-07-24 DIAGNOSIS — Y9289 Other specified places as the place of occurrence of the external cause: Secondary | ICD-10-CM | POA: Diagnosis not present

## 2015-07-24 DIAGNOSIS — S61412A Laceration without foreign body of left hand, initial encounter: Secondary | ICD-10-CM

## 2015-07-24 MED ORDER — OXYCODONE-ACETAMINOPHEN 5-325 MG PO TABS
1.0000 | ORAL_TABLET | Freq: Once | ORAL | Status: AC
  Administered 2015-07-24: 1 via ORAL
  Filled 2015-07-24: qty 1

## 2015-07-24 MED ORDER — CEPHALEXIN 500 MG PO CAPS: 500.0000 mg | ORAL_CAPSULE | Freq: Four times a day (QID) | ORAL | Status: DC

## 2015-07-24 MED ORDER — LIDOCAINE HCL (PF) 1 % IJ SOLN
30.0000 mL | Freq: Once | INTRAMUSCULAR | Status: AC
  Administered 2015-07-24: 30 mL via INTRADERMAL
  Filled 2015-07-24: qty 30

## 2015-07-24 MED ORDER — HYDROCODONE-ACETAMINOPHEN 5-325 MG PO TABS: 1.0000 | ORAL_TABLET | ORAL | Status: DC | PRN

## 2015-07-24 NOTE — Discharge Instructions (Signed)
Take the prescribed medication as directed.  If you plan to do more yard work, please make sure that your hand and sutures are covered. Sutures need to be removed in 1 week-- your primary care physician can do this for you. Return to the ED for new or worsening symptoms.
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
## 2016-04-11 ENCOUNTER — Ambulatory Visit: Payer: Self-pay | Admitting: Orthopedic Surgery

## 2016-04-17 ENCOUNTER — Ambulatory Visit: Payer: Self-pay | Admitting: Orthopedic Surgery

## 2016-04-17 ENCOUNTER — Other Ambulatory Visit (HOSPITAL_COMMUNITY): Payer: Self-pay | Admitting: *Deleted

## 2016-04-17 NOTE — Progress Notes (Signed)
STRESS TEST [facility] 09-17-15 , DID NOT SEND RESTING EKG

## 2016-04-17 NOTE — Patient Instructions (Addendum)
Alexander Strickland  04/17/2016   Your procedure is scheduled on: 05-02-16  Report to [facility] Main  Entrance take [facility]  elevators to 3rd floor to  [place] at 5:30 AM.  Call this number if you have problems the morning of surgery [phone]   Remember: ONLY 1 PERSON MAY GO WITH YOU TO SHORT STAY TO GET  READY MORNING OF Alexander Strickland.  Do not eat food or drink liquids :After Midnight.     Take these medicines the morning of surgery with A SIP OF WATER: AMLODININE(NORVASC), OMEPRAZOLE (PRILOSEC), SINEX NASAL SPRAY IF NEEDED                               You may not have any metal on your body including hair pins and              piercings  Do not wear jewelry, make-up, lotions, powders or perfumes, deodorant             Do not wear nail polish.  Do not shave  48 hours prior to surgery.              Men may shave face and neck.   Do not bring valuables to the hospital. [place].  Contacts, dentures or bridgework may not be worn into surgery.  Leave suitcase in the car. After surgery it may be brought to your room.     :  Special Instructions: coughing and deep breathing exercises, leg exercises              Please read over the following fact sheets you were given: _____________________________________________________________________             [facility] - Preparing for Surgery Before surgery, you can play an important role.  Because skin is not sterile, your skin needs to be as free of germs as possible.  You can reduce the number of germs on your skin by washing with CHG (chlorahexidine gluconate) soap before surgery.  CHG is an antiseptic cleaner which kills germs and bonds with the skin to continue killing germs even after washing. Please DO NOT use if you have an allergy to CHG or antibacterial soaps.  If your skin becomes reddened/irritated stop using the CHG and inform your nurse when you  arrive at Short Stay. Do not shave (including legs and underarms) for at least 48 hours prior to the first CHG shower.  You may shave your face/neck. Please follow these instructions carefully:  1.  Shower with CHG Soap the night before surgery and the  morning of Surgery.  2.  If you choose to wash your hair, wash your hair first as usual with your  normal  shampoo.  3.  After you shampoo, rinse your hair and body thoroughly to remove the  shampoo.                           4.  Use CHG as you would any other liquid soap.  You can apply chg directly  to the skin and wash  Gently with a scrungie or clean washcloth.  5.  Apply the CHG Soap to your body ONLY FROM THE NECK DOWN.   Do not use on face/ open                           Wound or open sores. Avoid contact with eyes, ears mouth and genitals (private parts).                       Wash face,  Genitals (private parts) with your normal soap.             6.  Wash thoroughly, paying special attention to the area where your surgery  will be performed.  7.  Thoroughly rinse your body with warm water from the neck down.  8.  DO NOT shower/wash with your normal soap after using and rinsing off  the CHG Soap.                9.  Pat yourself dry with a clean towel.            10.  Wear clean pajamas.            11.  Place clean sheets on your bed the night of your first shower and do not  sleep with pets. Day of Surgery : Do not apply any lotions/deodorants the morning of surgery.  Please wear clean clothes to the hospital/surgery center.  FAILURE TO FOLLOW THESE INSTRUCTIONS MAY RESULT IN THE CANCELLATION OF YOUR SURGERY PATIENT SIGNATURE_________________________________  NURSE SIGNATURE__________________________________  ________________________________________________________________________   Alexander Strickland  An incentive spirometer is a tool that can help keep your lungs clear and active. This tool measures how  well you are filling your lungs with each breath. Taking long deep breaths may help reverse or decrease the chance of developing breathing (pulmonary) problems (especially infection) following:  A long period of time when you are unable to move or be active. BEFORE THE PROCEDURE   If the spirometer includes an indicator to show your best effort, your nurse or respiratory therapist will set it to a desired goal.  If possible, sit up straight or lean slightly forward. Try not to slouch.  Hold the incentive spirometer in an upright position. INSTRUCTIONS FOR USE   Sit on the edge of your bed if possible, or sit up as far as you can in bed or on a chair.  Hold the incentive spirometer in an upright position.  Breathe out normally.  Place the mouthpiece in your mouth and seal your lips tightly around it.  Breathe in slowly and as deeply as possible, raising the piston or the ball toward the top of the column.  Hold your breath for 3-5 seconds or for as long as possible. Allow the piston or ball to fall to the bottom of the column.  Remove the mouthpiece from your mouth and breathe out normally.  Rest for a few seconds and repeat Steps 1 through 7 at least 10 times every 1-2 hours when you are awake. Take your time and take a few normal breaths between deep breaths.  The spirometer may include an indicator to show your best effort. Use the indicator as a goal to work toward during each repetition.  After each set of 10 deep breaths, practice coughing to be sure your lungs are clear. If you have an incision (the cut made at the time of surgery),  support your incision when coughing by placing a pillow or rolled up towels firmly against it. Once you are able to get out of bed, walk around indoors and cough well. You may stop using the incentive spirometer when instructed by your caregiver.  RISKS AND COMPLICATIONS  Take your time so you do not get dizzy or light-headed.  If you are in  pain, you may need to take or ask for pain medication before doing incentive spirometry. It is harder to take a deep breath if you are having pain. AFTER USE  Rest and breathe slowly and easily.  It can be helpful to keep track of a log of your progress. Your caregiver can provide you with a simple table to help with this. If you are using the spirometer at home, follow these instructions: [place] IF:   You are having difficultly using the spirometer.  You have trouble using the spirometer as often as instructed.  Your pain medication is not giving enough relief while using the spirometer.  You develop fever of 100.5 F (38.1 C) or higher. SEEK IMMEDIATE MEDICAL CARE IF:   You cough up bloody sputum that had not been present before.  You develop fever of 102 F (38.9 C) or greater.  You develop worsening pain at or near the incision site. MAKE SURE YOU:   Understand these instructions.  Will watch your condition.  Will get help right away if you are not doing well or get worse. Document Released: 04/20/2007 Document Revised: 03/01/2012 Document Reviewed: 06/21/2007 ExitCare Patient Information 2014 ExitCare, [place].   ________________________________________________________________________  WHAT IS A BLOOD TRANSFUSION? Blood Transfusion Information  A transfusion is the replacement of blood or some of its parts. Blood is made up of multiple cells which provide different functions.  Red blood cells carry oxygen and are used for blood loss replacement.  White blood cells fight against infection.  Platelets control bleeding.  Plasma helps clot blood.  Other blood products are available for specialized needs, such as hemophilia or other clotting disorders. BEFORE THE TRANSFUSION  Who gives blood for transfusions?   Healthy volunteers who are fully evaluated to make sure their blood is safe. This is blood bank blood. Transfusion therapy is the safest it has  ever been in the practice of medicine. Before blood is taken from a donor, a complete history is taken to make sure that person has no history of diseases nor engages in risky social behavior (examples are intravenous drug use or sexual activity with multiple partners). The donor's travel history is screened to minimize risk of transmitting infections, such as malaria. The donated blood is tested for signs of infectious diseases, such as HIV and hepatitis. The blood is then tested to be sure it is compatible with you in order to minimize the chance of a transfusion reaction. If you or a relative donates blood, this is often done in anticipation of surgery and is not appropriate for emergency situations. It takes many days to process the donated blood. RISKS AND COMPLICATIONS Although transfusion therapy is very safe and saves many lives, the main dangers of transfusion include:   Getting an infectious disease.  Developing a transfusion reaction. This is an allergic reaction to something in the blood you were given. Every precaution is taken to prevent this. The decision to have a blood transfusion has been considered carefully by your caregiver before blood is given. Blood is not given unless the benefits outweigh the risks. AFTER THE TRANSFUSION  Right after receiving a blood transfusion, you will usually feel much better and more energetic. This is especially true if your red blood cells have gotten low (anemic). The transfusion raises the level of the red blood cells which carry oxygen, and this usually causes an energy increase.  The nurse administering the transfusion will monitor you carefully for complications. HOME CARE INSTRUCTIONS  No special instructions are needed after a transfusion. You may find your energy is better. Speak with your caregiver about any limitations on activity for underlying diseases you may have. SEEK MEDICAL CARE IF:   Your condition is not improving after your  transfusion.  You develop redness or irritation at the intravenous (IV) site. SEEK IMMEDIATE MEDICAL CARE IF:  Any of the following symptoms occur over the next 12 hours:  Shaking chills.  You have a temperature by mouth above 102 F (38.9 C), not controlled by medicine.  Chest, back, or muscle pain.  People around you feel you are not acting correctly or are confused.  Shortness of breath or difficulty breathing.  Dizziness and fainting.  You get a rash or develop hives.  You have a decrease in urine output.  Your urine turns a dark color or changes to pink, red, or brown. Any of the following symptoms occur over the next 10 days:  You have a temperature by mouth above 102 F (38.9 C), not controlled by medicine.  Shortness of breath.  Weakness after normal activity.  The white part of the eye turns yellow (jaundice).  You have a decrease in the amount of urine or are urinating less often.  Your urine turns a dark color or changes to pink, red, or brown. Document Released: 12/05/2000 Document Revised: 03/01/2012 Document Reviewed: 07/24/2008 [facility] Patient Information 2014 [place], [place].  _______________________________________________________________________

## 2016-04-17 NOTE — H&P (Signed)
TOTAL HIP ADMISSION H&P  Patient is admitted for right total hip arthroplasty.  Subjective:  Chief Complaint: right hip pain  HPI: Alexander Strickland, 70 y.o. male, has a history of pain and functional disability in the right hip(s) due to arthritis and patient has failed non-surgical conservative treatments for greater than 12 weeks to include NSAID's and/or analgesics, viscosupplementation injections, use of assistive devices and activity modification.  Onset of symptoms was gradual starting 4 years ago with gradually worsening course since that time.The patient noted no past surgery on the right hip(s).  Patient currently rates pain in the right hip at 10 out of 10 with activity. Patient has night pain, worsening of pain with activity and weight bearing, pain that interfers with activities of daily living, pain with passive range of motion and joint swelling. Patient has evidence of subchondral cysts, subchondral sclerosis, periarticular osteophytes and joint space narrowing by imaging studies. This condition presents safety issues increasing the risk of falls. There is no current active infection.  Patient Active Problem List   Diagnosis Date Noted  . Dizziness - light-headed 07/03/2012  . Bradycardia 07/03/2012  . Hypertension 07/03/2012  . Chest pain 07/03/2012  . Anemia 07/03/2012   Past Medical History  Diagnosis Date  . Hypertension   . Anemia     Past Surgical History  Procedure Laterality Date  . Knee surgery      Bilateral TKRs  . Shoulder surgery      Left Rotator Cuff Repair     (Not in a hospital admission) No Known Allergies  Social History  Substance Use Topics  . Smoking status: Former Research scientist (life sciences)  . Smokeless tobacco: Not on file  . Alcohol Use: No     Comment: History of ETOH abuse, no drinks x3 years    Family History  Problem Relation Age of Onset  . Coronary artery disease Father   . Coronary artery disease Brother   . Coronary artery disease Sister     X 2   . Diabetes type II Father   . Diabetes type II Brother   . Heart failure Mother      Review of Systems  Constitutional: Negative.   HENT: Negative.   Eyes: Negative.   Respiratory: Negative.   Gastrointestinal: Negative.   Genitourinary: Positive for frequency.  Musculoskeletal: Positive for joint pain.  Skin: Negative.   Neurological: Negative.   Endo/Heme/Allergies: Negative.   Psychiatric/Behavioral: Negative.     Objective:  Physical Exam  Vitals reviewed. Constitutional: He is oriented to person, place, and time. He appears well-developed and well-nourished.  HENT:  Head: Normocephalic and atraumatic.  Eyes: Conjunctivae and EOM are normal. Pupils are equal, round, and reactive to light.  Neck: Normal range of motion. Neck supple.  Cardiovascular: Normal rate and regular rhythm.   Respiratory: Effort normal and breath sounds normal. No respiratory distress.  GI: Soft. Bowel sounds are normal.  Genitourinary:  deferred  Neurological: He is alert and oriented to person, place, and time. He has normal reflexes.  Skin: Skin is warm.  Psychiatric: He has a normal mood and affect. His behavior is normal. Judgment and thought content normal.    Vital signs in last 24 hours: @VSRANGES @  Labs:   Estimated body mass index is 31.19 kg/(m^2) as calculated from the following:   Height as of 07/24/15: 6' (1.829 m).   Weight as of 07/24/15: 104.327 kg (230 lb).   Imaging Review Plain radiographs demonstrate severe degenerative joint disease of the right  hip(s). The bone quality appears to be adequate for age and reported activity level.  Assessment/Plan:  End stage arthritis, right hip(s)  The patient history, physical examination, clinical judgement of the provider and imaging studies are consistent with end stage degenerative joint disease of the right hip(s) and total hip arthroplasty is deemed medically necessary. The treatment options including medical management,  injection therapy, arthroscopy and arthroplasty were discussed at length. The risks and benefits of total hip arthroplasty were presented and reviewed. The risks due to aseptic loosening, infection, stiffness, dislocation/subluxation,  thromboembolic complications and other imponderables were discussed.  The patient acknowledged the explanation, agreed to proceed with the plan and consent was signed. Patient is being admitted for inpatient treatment for surgery, pain control, PT, OT, prophylactic antibiotics, VTE prophylaxis, progressive ambulation and ADL's and discharge planning.The patient is planning to be discharged home with home health services

## 2016-04-18 ENCOUNTER — Encounter (HOSPITAL_COMMUNITY): Payer: Self-pay

## 2016-04-18 ENCOUNTER — Encounter (HOSPITAL_COMMUNITY)
Admission: RE | Admit: 2016-04-18 | Discharge: 2016-04-18 | Disposition: A | Discharge status: Home / Self Care | Payer: No Typology Code available for payment source | Source: Ambulatory Visit | Attending: Orthopedic Surgery | Admitting: Orthopedic Surgery

## 2016-04-18 DIAGNOSIS — Z01818 Encounter for other preprocedural examination: Secondary | ICD-10-CM | POA: Diagnosis present

## 2016-04-18 HISTORY — DX: Unspecified osteoarthritis, unspecified site: M19.90

## 2016-04-18 HISTORY — DX: Gastro-esophageal reflux disease without esophagitis: K21.9

## 2016-04-18 LAB — CBC
HCT: 40.5 % (ref 39.0–52.0)
HEMOGLOBIN: 12.8 g/dL — AB (ref 13.0–17.0)
MCH: 27.5 pg (ref 26.0–34.0)
MCHC: 31.6 g/dL (ref 30.0–36.0)
MCV: 86.9 fL (ref 78.0–100.0)
PLATELETS: 302 10*3/uL (ref 150–400)
RBC: 4.66 MIL/uL (ref 4.22–5.81)
RDW: 13.3 % (ref 11.5–15.5)
WBC: 5.2 10*3/uL (ref 4.0–10.5)

## 2016-04-18 LAB — BASIC METABOLIC PANEL
ANION GAP: 9 (ref 5–15)
BUN: 32 mg/dL — ABNORMAL HIGH (ref 6–20)
CALCIUM: 9.9 mg/dL (ref 8.9–10.3)
CHLORIDE: 110 mmol/L (ref 101–111)
CO2: 26 mmol/L (ref 22–32)
CREATININE: 1.43 mg/dL — AB (ref 0.61–1.24)
GFR calc non Af Amer: 47 mL/min — ABNORMAL LOW (ref >60)
GFR, EST AFRICAN AMERICAN: 55 mL/min — AB (ref >60)
Glucose, Bld: 124 mg/dL — ABNORMAL HIGH (ref 65–99)
Potassium: 5.4 mmol/L — ABNORMAL HIGH (ref 3.5–5.1)
SODIUM: 145 mmol/L (ref 135–145)

## 2016-04-18 LAB — SURGICAL PCR SCREEN
MRSA, PCR: INVALID — AB
STAPHYLOCOCCUS AUREUS: INVALID — AB

## 2016-04-18 NOTE — Progress Notes (Signed)
Surgical clearance with recommendations (needs dental clearance) - Dr. Luther Redo - in chart

## 2016-04-18 NOTE — Progress Notes (Signed)
04-18-16 - BMP lab results from preop visit on 04-18-16 faxed to Dr. Lyla Glassing via [facility]

## 2016-04-18 NOTE — Progress Notes (Signed)
04-18-16 - EKG - EPIC

## 2016-04-20 LAB — MRSA CULTURE

## 2016-05-02 ENCOUNTER — Inpatient Hospital Stay (HOSPITAL_COMMUNITY): Payer: No Typology Code available for payment source

## 2016-05-02 ENCOUNTER — Inpatient Hospital Stay (HOSPITAL_COMMUNITY): Payer: No Typology Code available for payment source | Admitting: Anesthesiology

## 2016-05-02 ENCOUNTER — Inpatient Hospital Stay (HOSPITAL_COMMUNITY)
Admission: RE | Admit: 2016-05-02 | Discharge: 2016-05-05 | DRG: 470 | Disposition: A | Discharge status: Skilled Nursing Facility | Payer: No Typology Code available for payment source | Source: Ambulatory Visit | Attending: Orthopedic Surgery | Admitting: Orthopedic Surgery

## 2016-05-02 ENCOUNTER — Encounter (HOSPITAL_COMMUNITY): Payer: Self-pay | Admitting: *Deleted

## 2016-05-02 ENCOUNTER — Encounter (HOSPITAL_COMMUNITY)
Admission: RE | Disposition: A | Discharge status: Skilled Nursing Facility | Payer: Self-pay | Source: Ambulatory Visit | Attending: Orthopedic Surgery

## 2016-05-02 DIAGNOSIS — Z79899 Other long term (current) drug therapy: Secondary | ICD-10-CM

## 2016-05-02 DIAGNOSIS — Z96653 Presence of artificial knee joint, bilateral: Secondary | ICD-10-CM | POA: Diagnosis present

## 2016-05-02 DIAGNOSIS — D62 Acute posthemorrhagic anemia: Secondary | ICD-10-CM | POA: Diagnosis not present

## 2016-05-02 DIAGNOSIS — Z87891 Personal history of nicotine dependence: Secondary | ICD-10-CM | POA: Diagnosis not present

## 2016-05-02 DIAGNOSIS — K219 Gastro-esophageal reflux disease without esophagitis: Secondary | ICD-10-CM | POA: Diagnosis not present

## 2016-05-02 DIAGNOSIS — M25551 Pain in right hip: Secondary | ICD-10-CM

## 2016-05-02 DIAGNOSIS — I10 Essential (primary) hypertension: Secondary | ICD-10-CM | POA: Diagnosis present

## 2016-05-02 DIAGNOSIS — M1611 Unilateral primary osteoarthritis, right hip: Secondary | ICD-10-CM | POA: Diagnosis present

## 2016-05-02 DIAGNOSIS — Z09 Encounter for follow-up examination after completed treatment for conditions other than malignant neoplasm: Secondary | ICD-10-CM

## 2016-05-02 HISTORY — PX: TOTAL HIP ARTHROPLASTY: SHX124

## 2016-05-02 LAB — TYPE AND SCREEN
ABO/RH(D): A POS
Antibody Screen: NEGATIVE

## 2016-05-02 LAB — ABO/RH: ABO/RH(D): A POS

## 2016-05-02 SURGERY — ARTHROPLASTY, HIP, TOTAL, ANTERIOR APPROACH
Anesthesia: Spinal | Site: Hip | Laterality: Right

## 2016-05-02 MED ORDER — DEXAMETHASONE SODIUM PHOSPHATE 10 MG/ML IJ SOLN
INTRAMUSCULAR | Status: DC | PRN
  Administered 2016-05-02: 10 mg via INTRAVENOUS

## 2016-05-02 MED ORDER — PHENOL 1.4 % MT LIQD: 1.0000 | OROMUCOSAL | Status: DC | PRN

## 2016-05-02 MED ORDER — MAGNESIUM CITRATE PO SOLN: 1.0000 | Freq: Once | ORAL | Status: DC | PRN

## 2016-05-02 MED ORDER — ISOPROPYL ALCOHOL 70 % SOLN
Status: DC | PRN
  Administered 2016-05-02: 1 via TOPICAL

## 2016-05-02 MED ORDER — CEFAZOLIN SODIUM-DEXTROSE 2-4 GM/100ML-% IV SOLN
INTRAVENOUS | Status: AC
  Filled 2016-05-02: qty 100

## 2016-05-02 MED ORDER — CEFAZOLIN SODIUM-DEXTROSE 2-4 GM/100ML-% IV SOLN
2.0000 g | INTRAVENOUS | Status: AC
  Administered 2016-05-02: 2 g via INTRAVENOUS
  Filled 2016-05-02: qty 100

## 2016-05-02 MED ORDER — BUPIVACAINE-EPINEPHRINE (PF) 0.25% -1:200000 IJ SOLN
INTRAMUSCULAR | Status: AC
  Filled 2016-05-02: qty 30

## 2016-05-02 MED ORDER — BUPIVACAINE HCL (PF) 0.5 % IJ SOLN
INTRAMUSCULAR | Status: AC
  Filled 2016-05-02: qty 30

## 2016-05-02 MED ORDER — PROPOFOL 10 MG/ML IV BOLUS
INTRAVENOUS | Status: AC
  Filled 2016-05-02: qty 20

## 2016-05-02 MED ORDER — ACETAMINOPHEN 10 MG/ML IV SOLN
1000.0000 mg | INTRAVENOUS | Status: AC
  Administered 2016-05-02: 1000 mg via INTRAVENOUS
  Filled 2016-05-02: qty 100

## 2016-05-02 MED ORDER — MIDAZOLAM HCL 5 MG/5ML IJ SOLN
INTRAMUSCULAR | Status: DC | PRN
  Administered 2016-05-02: 2 mg via INTRAVENOUS

## 2016-05-02 MED ORDER — ONDANSETRON HCL 4 MG PO TABS: 4.0000 mg | ORAL_TABLET | Freq: Three times a day (TID) | ORAL | Status: DC | PRN

## 2016-05-02 MED ORDER — SODIUM CHLORIDE 0.9 % IJ SOLN
INTRAMUSCULAR | Status: AC
  Filled 2016-05-02: qty 50

## 2016-05-02 MED ORDER — HYDROMORPHONE HCL 1 MG/ML IJ SOLN
INTRAMUSCULAR | Status: AC
  Filled 2016-05-02: qty 1

## 2016-05-02 MED ORDER — AMLODIPINE BESYLATE 5 MG PO TABS
5.0000 mg | ORAL_TABLET | Freq: Every day | ORAL | Status: DC
  Administered 2016-05-02 – 2016-05-05 (×4): 5 mg via ORAL
  Filled 2016-05-02 (×4): qty 1

## 2016-05-02 MED ORDER — EPHEDRINE SULFATE 50 MG/ML IJ SOLN
INTRAMUSCULAR | Status: DC | PRN
  Administered 2016-05-02: 5 mg via INTRAVENOUS
  Administered 2016-05-02 (×2): 10 mg via INTRAVENOUS
  Administered 2016-05-02: 5 mg via INTRAVENOUS

## 2016-05-02 MED ORDER — ONDANSETRON HCL 4 MG/2ML IJ SOLN
INTRAMUSCULAR | Status: AC
  Filled 2016-05-02: qty 2

## 2016-05-02 MED ORDER — DEXAMETHASONE SODIUM PHOSPHATE 10 MG/ML IJ SOLN
INTRAMUSCULAR | Status: AC
  Filled 2016-05-02: qty 1

## 2016-05-02 MED ORDER — CEFAZOLIN SODIUM-DEXTROSE 2-4 GM/100ML-% IV SOLN
2.0000 g | Freq: Four times a day (QID) | INTRAVENOUS | Status: AC
  Administered 2016-05-02 (×2): 2 g via INTRAVENOUS
  Filled 2016-05-02 (×2): qty 100

## 2016-05-02 MED ORDER — DOCUSATE SODIUM 100 MG PO CAPS: 100.0000 mg | ORAL_CAPSULE | Freq: Two times a day (BID) | ORAL | Status: AC

## 2016-05-02 MED ORDER — KETOROLAC TROMETHAMINE 30 MG/ML IJ SOLN
INTRAMUSCULAR | Status: DC | PRN
  Administered 2016-05-02: 30 mg via INTRA_ARTICULAR

## 2016-05-02 MED ORDER — ROCURONIUM BROMIDE 50 MG/5ML IV SOLN
INTRAVENOUS | Status: AC
  Filled 2016-05-02: qty 1

## 2016-05-02 MED ORDER — SORBITOL 70 % SOLN
30.0000 mL | Freq: Every day | Status: DC | PRN
  Filled 2016-05-02: qty 30

## 2016-05-02 MED ORDER — KETOROLAC TROMETHAMINE 15 MG/ML IJ SOLN
7.5000 mg | Freq: Four times a day (QID) | INTRAMUSCULAR | Status: AC
  Administered 2016-05-02 – 2016-05-03 (×3): 7.5 mg via INTRAVENOUS
  Filled 2016-05-02 (×4): qty 1

## 2016-05-02 MED ORDER — BUPIVACAINE-EPINEPHRINE 0.25% -1:200000 IJ SOLN
INTRAMUSCULAR | Status: DC | PRN
  Administered 2016-05-02: 30 mL

## 2016-05-02 MED ORDER — ASPIRIN EC 81 MG PO TBEC
81.0000 mg | DELAYED_RELEASE_TABLET | Freq: Two times a day (BID) | ORAL | Status: DC
  Administered 2016-05-02 – 2016-05-05 (×6): 81 mg via ORAL
  Filled 2016-05-02 (×6): qty 1

## 2016-05-02 MED ORDER — AMLODIPINE BESYLATE 5 MG PO TABS: 5.0000 mg | ORAL_TABLET | Freq: Every day | ORAL | Status: AC

## 2016-05-02 MED ORDER — PREGABALIN 75 MG PO CAPS: 75.0000 mg | ORAL_CAPSULE | Freq: Two times a day (BID) | ORAL | Status: DC

## 2016-05-02 MED ORDER — CHLORHEXIDINE GLUCONATE 4 % EX LIQD: 60.0000 mL | Freq: Once | CUTANEOUS | Status: DC

## 2016-05-02 MED ORDER — ONDANSETRON HCL 4 MG/2ML IJ SOLN: 4.0000 mg | Freq: Four times a day (QID) | INTRAMUSCULAR | Status: DC | PRN

## 2016-05-02 MED ORDER — SENNA 8.6 MG PO TABS
2.0000 | ORAL_TABLET | Freq: Every day | ORAL | Status: DC
  Administered 2016-05-02 – 2016-05-03 (×2): 17.2 mg via ORAL
  Filled 2016-05-02 (×3): qty 2

## 2016-05-02 MED ORDER — HYDROGEN PEROXIDE 3 % EX SOLN
CUTANEOUS | Status: DC | PRN
  Administered 2016-05-02: 1

## 2016-05-02 MED ORDER — HYDROMORPHONE HCL 1 MG/ML IJ SOLN
0.2500 mg | INTRAMUSCULAR | Status: DC | PRN
  Administered 2016-05-02 (×3): 0.5 mg via INTRAVENOUS
  Administered 2016-05-02 (×2): 0.25 mg via INTRAVENOUS

## 2016-05-02 MED ORDER — SODIUM CHLORIDE 0.9 % IR SOLN
Status: DC | PRN
  Administered 2016-05-02: 1000 mL

## 2016-05-02 MED ORDER — POVIDONE-IODINE 10 % EX SWAB: 2.0000 "application " | Freq: Once | CUTANEOUS | Status: DC

## 2016-05-02 MED ORDER — ONDANSETRON HCL 4 MG PO TABS
4.0000 mg | ORAL_TABLET | Freq: Four times a day (QID) | ORAL | Status: DC | PRN
  Administered 2016-05-04: 4 mg via ORAL
  Filled 2016-05-02: qty 1

## 2016-05-02 MED ORDER — ONDANSETRON HCL 4 MG/2ML IJ SOLN
4.0000 mg | Freq: Once | INTRAMUSCULAR | Status: DC | PRN
Start: 2016-05-02 — End: 2016-05-02

## 2016-05-02 MED ORDER — NAPHAZOLINE-GLYCERIN 0.012-0.2 % OP SOLN
1.0000 [drp] | Freq: Four times a day (QID) | OPHTHALMIC | Status: DC | PRN
  Filled 2016-05-02: qty 15

## 2016-05-02 MED ORDER — HYDROGEN PEROXIDE 3 % EX SOLN
CUTANEOUS | Status: AC
  Filled 2016-05-02: qty 473

## 2016-05-02 MED ORDER — OXYCODONE-ACETAMINOPHEN 5-325 MG PO TABS: 1.0000 | ORAL_TABLET | ORAL | Status: AC | PRN

## 2016-05-02 MED ORDER — MIDAZOLAM HCL 2 MG/2ML IJ SOLN
INTRAMUSCULAR | Status: AC
  Filled 2016-05-02: qty 2

## 2016-05-02 MED ORDER — KETOROLAC TROMETHAMINE 15 MG/ML IJ SOLN
INTRAMUSCULAR | Status: AC
  Filled 2016-05-02: qty 1

## 2016-05-02 MED ORDER — DIPHENHYDRAMINE HCL 12.5 MG/5ML PO ELIX: 12.5000 mg | ORAL_SOLUTION | ORAL | Status: DC | PRN

## 2016-05-02 MED ORDER — LACTATED RINGERS IV SOLN
INTRAVENOUS | Status: DC | PRN
  Administered 2016-05-02 (×3): via INTRAVENOUS

## 2016-05-02 MED ORDER — ACETAMINOPHEN 10 MG/ML IV SOLN
INTRAVENOUS | Status: AC
  Filled 2016-05-02: qty 100

## 2016-05-02 MED ORDER — OXYCODONE HCL 5 MG/5ML PO SOLN
5.0000 mg | Freq: Once | ORAL | Status: DC | PRN
  Filled 2016-05-02: qty 5

## 2016-05-02 MED ORDER — KETOROLAC TROMETHAMINE 30 MG/ML IJ SOLN
INTRAMUSCULAR | Status: AC
  Filled 2016-05-02: qty 1

## 2016-05-02 MED ORDER — FENTANYL CITRATE (PF) 100 MCG/2ML IJ SOLN
INTRAMUSCULAR | Status: DC | PRN
  Administered 2016-05-02: 100 ug via INTRAVENOUS

## 2016-05-02 MED ORDER — SODIUM CHLORIDE 0.9 % IR SOLN
Status: DC | PRN
  Administered 2016-05-02: 3000 mL

## 2016-05-02 MED ORDER — SODIUM CHLORIDE 0.9 % IV SOLN
INTRAVENOUS | Status: DC
  Administered 2016-05-02: 150 mL/h via INTRAVENOUS

## 2016-05-02 MED ORDER — WATER FOR IRRIGATION, STERILE IR SOLN
Status: DC | PRN
  Administered 2016-05-02: 1000 mL

## 2016-05-02 MED ORDER — HYDROMORPHONE HCL 1 MG/ML IJ SOLN: 0.5000 mg | INTRAMUSCULAR | Status: DC | PRN

## 2016-05-02 MED ORDER — ASPIRIN 81 MG PO TABS: 81.0000 mg | ORAL_TABLET | Freq: Two times a day (BID) | ORAL | Status: DC

## 2016-05-02 MED ORDER — METOCLOPRAMIDE HCL 5 MG PO TABS
5.0000 mg | ORAL_TABLET | Freq: Three times a day (TID) | ORAL | Status: DC | PRN
Start: 2016-05-02 — End: 2016-05-05

## 2016-05-02 MED ORDER — METHOCARBAMOL 500 MG PO TABS
500.0000 mg | ORAL_TABLET | Freq: Four times a day (QID) | ORAL | Status: DC | PRN
  Administered 2016-05-02 – 2016-05-05 (×7): 500 mg via ORAL
  Filled 2016-05-02 (×8): qty 1

## 2016-05-02 MED ORDER — OXYCODONE HCL 5 MG PO TABS
5.0000 mg | ORAL_TABLET | ORAL | Status: DC | PRN
  Administered 2016-05-02 – 2016-05-05 (×16): 10 mg via ORAL
  Filled 2016-05-02 (×18): qty 2

## 2016-05-02 MED ORDER — TRANEXAMIC ACID 1000 MG/10ML IV SOLN
1000.0000 mg | INTRAVENOUS | Status: AC
  Administered 2016-05-02: 1000 mg via INTRAVENOUS
  Filled 2016-05-02: qty 10

## 2016-05-02 MED ORDER — OMEPRAZOLE MAGNESIUM 20 MG PO TBEC
20.0000 mg | DELAYED_RELEASE_TABLET | Freq: Every day | ORAL | Status: DC
  Administered 2016-05-02 – 2016-05-04 (×3): 20 mg via ORAL
  Filled 2016-05-02 (×4): qty 1

## 2016-05-02 MED ORDER — ONDANSETRON HCL 4 MG/2ML IJ SOLN
INTRAMUSCULAR | Status: DC | PRN
  Administered 2016-05-02: 4 mg via INTRAVENOUS

## 2016-05-02 MED ORDER — SODIUM CHLORIDE 0.9 % IV SOLN: INTRAVENOUS | Status: DC

## 2016-05-02 MED ORDER — LIDOCAINE HCL (CARDIAC) 20 MG/ML IV SOLN
INTRAVENOUS | Status: AC
  Filled 2016-05-02: qty 5

## 2016-05-02 MED ORDER — ACETAMINOPHEN 325 MG PO TABS
650.0000 mg | ORAL_TABLET | Freq: Four times a day (QID) | ORAL | Status: DC | PRN
  Administered 2016-05-04: 650 mg via ORAL
  Filled 2016-05-02: qty 2

## 2016-05-02 MED ORDER — METHOCARBAMOL 1000 MG/10ML IJ SOLN
500.0000 mg | Freq: Four times a day (QID) | INTRAVENOUS | Status: DC | PRN
  Administered 2016-05-02: 500 mg via INTRAVENOUS
  Filled 2016-05-02: qty 550
  Filled 2016-05-02: qty 5

## 2016-05-02 MED ORDER — BUPIVACAINE HCL (PF) 0.5 % IJ SOLN
INTRAMUSCULAR | Status: DC | PRN
  Administered 2016-05-02: 15 mg via INTRATHECAL

## 2016-05-02 MED ORDER — FENTANYL CITRATE (PF) 100 MCG/2ML IJ SOLN
INTRAMUSCULAR | Status: AC
  Filled 2016-05-02: qty 2

## 2016-05-02 MED ORDER — SODIUM CHLORIDE 0.9 % IJ SOLN
INTRAMUSCULAR | Status: DC | PRN
  Administered 2016-05-02: 30 mL

## 2016-05-02 MED ORDER — METOCLOPRAMIDE HCL 5 MG/ML IJ SOLN: 5.0000 mg | Freq: Three times a day (TID) | INTRAMUSCULAR | Status: DC | PRN

## 2016-05-02 MED ORDER — ISOPROPYL ALCOHOL 70 % SOLN
Status: AC
  Filled 2016-05-02: qty 480

## 2016-05-02 MED ORDER — PROPOFOL 500 MG/50ML IV EMUL
INTRAVENOUS | Status: DC | PRN
  Administered 2016-05-02: 150 ug/kg/min via INTRAVENOUS

## 2016-05-02 MED ORDER — VITAMIN D3 25 MCG (1000 UNIT) PO TABS
1000.0000 [IU] | ORAL_TABLET | Freq: Every day | ORAL | Status: DC
  Filled 2016-05-02: qty 1

## 2016-05-02 MED ORDER — DEXAMETHASONE SODIUM PHOSPHATE 10 MG/ML IJ SOLN
10.0000 mg | Freq: Once | INTRAMUSCULAR | Status: AC
  Administered 2016-05-03: 10 mg via INTRAVENOUS
  Filled 2016-05-02: qty 1

## 2016-05-02 MED ORDER — SODIUM CHLORIDE 0.9 % IV SOLN
1000.0000 mg | Freq: Once | INTRAVENOUS | Status: AC
  Administered 2016-05-02: 1000 mg via INTRAVENOUS
  Filled 2016-05-02: qty 10

## 2016-05-02 MED ORDER — OXYCODONE HCL 5 MG PO TABS: 5.0000 mg | ORAL_TABLET | Freq: Once | ORAL | Status: DC | PRN

## 2016-05-02 MED ORDER — DOCUSATE SODIUM 100 MG PO CAPS
100.0000 mg | ORAL_CAPSULE | Freq: Two times a day (BID) | ORAL | Status: DC
  Administered 2016-05-02 – 2016-05-05 (×6): 100 mg via ORAL
  Filled 2016-05-02 (×6): qty 1

## 2016-05-02 MED ORDER — MENTHOL 3 MG MT LOZG: 1.0000 | LOZENGE | OROMUCOSAL | Status: DC | PRN

## 2016-05-02 MED ORDER — ACETAMINOPHEN 650 MG RE SUPP: 650.0000 mg | Freq: Four times a day (QID) | RECTAL | Status: DC | PRN

## 2016-05-02 SURGICAL SUPPLY — 43 items
BAG DECANTER FOR FLEXI CONT (MISCELLANEOUS) IMPLANT
BAG ZIPLOCK 12X15 (MISCELLANEOUS) IMPLANT
CAPT HIP TOTAL 2 ×3 IMPLANT
CHLORAPREP W/TINT 26ML (MISCELLANEOUS) ×3 IMPLANT
CLOTH BEACON ORANGE TIMEOUT ST (SAFETY) ×3 IMPLANT
COVER PERINEAL POST (MISCELLANEOUS) ×3 IMPLANT
DECANTER SPIKE VIAL GLASS SM (MISCELLANEOUS) ×3 IMPLANT
DRAPE LG THREE QUARTER DISP (DRAPES) ×6 IMPLANT
DRAPE STERI IOBAN 125X83 (DRAPES) ×3 IMPLANT
DRAPE U-SHAPE 47X51 STRL (DRAPES) ×6 IMPLANT
DRSG AQUACEL AG ADV 3.5X10 (GAUZE/BANDAGES/DRESSINGS) ×3 IMPLANT
ELECT REM PT RETURN 15FT ADLT (MISCELLANEOUS) ×3 IMPLANT
GAUZE SPONGE 4X4 12PLY STRL (GAUZE/BANDAGES/DRESSINGS) ×3 IMPLANT
GLOVE BIO SURGEON STRL SZ8.5 (GLOVE) ×6 IMPLANT
GLOVE BIOGEL PI IND STRL 7.0 (GLOVE) ×1 IMPLANT
GLOVE BIOGEL PI IND STRL 8.5 (GLOVE) ×1 IMPLANT
GLOVE BIOGEL PI INDICATOR 7.0 (GLOVE) ×2
GLOVE BIOGEL PI INDICATOR 8.5 (GLOVE) ×2
GLOVE SURG SS PI 6.5 STRL IVOR (GLOVE) ×3 IMPLANT
GOWN SPEC L3 XXLG W/TWL (GOWN DISPOSABLE) ×3 IMPLANT
HANDPIECE INTERPULSE COAX TIP (DISPOSABLE) ×2
HOLDER FOLEY CATH W/STRAP (MISCELLANEOUS) ×3 IMPLANT
HOOD PEEL AWAY FLYTE STAYCOOL (MISCELLANEOUS) ×6 IMPLANT
LIQUID BAND (GAUZE/BANDAGES/DRESSINGS) ×6 IMPLANT
MARKER SKIN DUAL TIP RULER LAB (MISCELLANEOUS) ×6 IMPLANT
NEEDLE SPNL 18GX3.5 QUINCKE PK (NEEDLE) ×3 IMPLANT
PACK ANTERIOR HIP CUSTOM (KITS) ×3 IMPLANT
SAW OSC TIP CART 19.5X105X1.3 (SAW) ×3 IMPLANT
SEALER BIPOLAR AQUA 6.0 (INSTRUMENTS) ×3 IMPLANT
SET HNDPC FAN SPRY TIP SCT (DISPOSABLE) ×1 IMPLANT
SOL PREP POV-IOD 4OZ 10% (MISCELLANEOUS) ×3 IMPLANT
SUT ETHIBOND NAB CT1 #1 30IN (SUTURE) ×6 IMPLANT
SUT MNCRL AB 3-0 PS2 18 (SUTURE) ×3 IMPLANT
SUT MON AB 2-0 CT1 36 (SUTURE) ×6 IMPLANT
SUT VIC AB 1 CT1 36 (SUTURE) ×3 IMPLANT
SUT VIC AB 2-0 CT1 27 (SUTURE) ×2
SUT VIC AB 2-0 CT1 TAPERPNT 27 (SUTURE) ×1 IMPLANT
SUT VLOC 180 0 24IN GS25 (SUTURE) ×3 IMPLANT
SYR 50ML LL SCALE MARK (SYRINGE) ×3 IMPLANT
TRAY FOLEY W/METER SILVER 14FR (SET/KITS/TRAYS/PACK) IMPLANT
TRAY FOLEY W/METER SILVER 16FR (SET/KITS/TRAYS/PACK) ×3 IMPLANT
WATER STERILE IRR 1500ML POUR (IV SOLUTION) ×3 IMPLANT
YANKAUER SUCT BULB TIP 10FT TU (MISCELLANEOUS) ×3 IMPLANT

## 2016-05-02 NOTE — Progress Notes (Signed)
Pt slow to answer questions.  C/O pain 7/10 right shoulder.  Also states he has had congestion and cough over past week but no fever.

## 2016-05-02 NOTE — Discharge Instructions (Signed)
°Dr. Jerame Hedding °Joint Replacement Specialist °[facility] °[address] °[place], [place] [address] °[phone] ° ° °TOTAL HIP REPLACEMENT POSTOPERATIVE DIRECTIONS ° ° ° °Hip Rehabilitation, Guidelines Following Surgery  ° °WEIGHT BEARING °Weight bearing as tolerated with assist device (walker, cane, etc) as directed, use it as long as suggested by your surgeon or therapist, typically at least 4-6 weeks. ° °The results of a hip operation are greatly improved after range of motion and muscle strengthening exercises. Follow all safety measures which are given to protect your hip. If any of these exercises cause increased pain or swelling in your joint, decrease the amount until you are comfortable again. Then slowly increase the exercises. Call your caregiver if you have problems or questions.  ° °HOME CARE INSTRUCTIONS  °Most of the following instructions are designed to prevent the dislocation of your new hip.  °Remove items at home which could result in a fall. This includes throw rugs or furniture in walking pathways.  °Continue medications as instructed at time of discharge. °· You may have some home medications which will be placed on hold until you complete the course of blood thinner medication. °· You may start showering once you are discharged home. Do not remove your dressing. °Do not put on socks or shoes without following the instructions of your caregivers.   °Sit on chairs with arms. Use the chair arms to help push yourself up when arising.  °Arrange for the use of a toilet seat elevator so you are not sitting low.  °· Walk with walker as instructed.  °You may resume a sexual relationship in one month or when given the OK by your caregiver.  °Use walker as long as suggested by your caregivers.  °You may put full weight on your legs and walk as much as is comfortable. °Avoid periods of inactivity such as sitting longer than an hour when not asleep. This helps prevent  blood clots.  °You may return to work once you are cleared by your surgeon.  °Do not drive a car for 6 weeks or until released by your surgeon.  °Do not drive while taking narcotics.  °Wear elastic stockings for two weeks following surgery during the day but you may remove then at night.  °Make sure you keep all of your appointments after your operation with all of your doctors and caregivers. You should call the office at the above phone number and make an appointment for approximately two weeks after the date of your surgery. °Please pick up a stool softener and laxative for home use as long as you are requiring pain medications. °· ICE to the affected hip every three hours for 30 minutes at a time and then as needed for pain and swelling. Continue to use ice on the hip for pain and swelling from surgery. You may notice swelling that will progress down to the foot and ankle.  This is normal after surgery.  Elevate the leg when you are not up walking on it.   °It is important for you to complete the blood thinner medication as prescribed by your doctor. °· Continue to use the breathing machine which will help keep your temperature down.  It is common for your temperature to cycle up and down following surgery, especially at night when you are not up moving around and exerting yourself.  The breathing machine keeps your lungs expanded and your temperature down. ° °RANGE OF MOTION AND STRENGTHENING EXERCISES  °These exercises are   designed to help you keep full movement of your hip joint. Follow your caregiver's or physical therapist's instructions. Perform all exercises about fifteen times, three times per day or as directed. Exercise both hips, even if you have had only one joint replacement. These exercises can be done on a training (exercise) mat, on the floor, on a table or on a bed. Use whatever works the best and is most comfortable for you. Use music or television while you are exercising so that the exercises  are a pleasant break in your day. This will make your life better with the exercises acting as a break in routine you can look forward to.  °Lying on your back, slowly slide your foot toward your buttocks, raising your knee up off the floor. Then slowly slide your foot back down until your leg is straight again.  °Lying on your back spread your legs as far apart as you can without causing discomfort.  °Lying on your side, raise your upper leg and foot straight up from the floor as far as is comfortable. Slowly lower the leg and repeat.  °Lying on your back, tighten up the muscle in the front of your thigh (quadriceps muscles). You can do this by keeping your leg straight and trying to raise your heel off the floor. This helps strengthen the largest muscle supporting your knee.  °Lying on your back, tighten up the muscles of your buttocks both with the legs straight and with the knee bent at a comfortable angle while keeping your heel on the floor.  ° °SKILLED REHAB INSTRUCTIONS: °If the patient is transferred to a skilled rehab facility following release from the hospital, a list of the current medications will be sent to the facility for the patient to continue.  When discharged from the skilled rehab facility, please have the facility set up the patient's Home Health Physical Therapy prior to being released. Also, the skilled facility will be responsible for providing the patient with their medications at time of release from the facility to include their pain medication and their blood thinner medication. If the patient is still at the rehab facility at time of the two week follow up appointment, the skilled rehab facility will also need to assist the patient in arranging follow up appointment in our office and any transportation needs. ° °MAKE SURE YOU:  °Understand these instructions.  °Will watch your condition.  °Will get help right away if you are not doing well or get worse. ° °Pick up stool softner and  laxative for home use following surgery while on pain medications. °Do not remove your dressing. °The dressing is waterproof--it is OK to take showers. °Continue to use ice for pain and swelling after surgery. °Do not use any lotions or creams on the incision until instructed by your surgeon. °Total Hip Protocol. ° ° °

## 2016-05-02 NOTE — Discharge Summary (Signed)
Physician Discharge Summary  Patient ID: Alexander Strickland MRN: [id number] DOB/AGE: 09/01/1946 70 y.o.  Admit date: 05/02/2016 Discharge date: 05/05/2016  Admission Diagnoses:  Primary osteoarthritis of right hip  Discharge Diagnoses:  Principal Problem:   Primary osteoarthritis of right hip   Past Medical History  Diagnosis Date  . Hypertension   . Anemia   . GERD (gastroesophageal reflux disease)   . Arthritis     Surgeries: Procedure(s): RIGHT TOTAL HIP ARTHROPLASTY ANTERIOR APPROACH on 05/02/2016   Consultants (if any):    Discharged Condition: Improved  Hospital Course: Alexander Strickland is an 70 y.o. male who was admitted 05/02/2016 with a diagnosis of Primary osteoarthritis of right hip and went to the operating room on 05/02/2016 and underwent the above named procedures.    He was given perioperative antibiotics:      Anti-infectives    Start     Dose/Rate Route Frequency Ordered Stop   05/02/16 1330  ceFAZolin (ANCEF) IVPB 2g/100 mL premix     2 g 200 mL/hr over 30 Minutes Intravenous Every 6 hours 05/02/16 1207 05/02/16 2059   05/02/16 0634  ceFAZolin (ANCEF) IVPB 2g/100 mL premix     2 g 200 mL/hr over 30 Minutes Intravenous On call to O.R. 05/02/16 [id number] 05/02/16 [id number]    .  He was given sequential compression devices, early ambulation, and ASA 81 mg PO BID for DVT prophylaxis.  He benefited maximally from the hospital stay and there were no complications.    Recent vital signs:  Filed Vitals:   05/04/16 2200 05/05/16 0550  BP: 149/71 136/69  Pulse: 58 60  Temp: 98.4 F (36.9 C) 97.8 F (36.6 C)  Resp: 18 18    Recent laboratory studies:  Lab Results  Component Value Date   HGB 8.7* 05/05/2016   HGB 9.1* 05/04/2016   HGB 8.8* 05/03/2016   Lab Results  Component Value Date   WBC 7.6 05/05/2016   PLT 250 05/05/2016   Lab Results  Component Value Date   INR 1.3 05/10/2009   Lab Results  Component Value Date   NA 137 05/03/2016   K 4.9  05/03/2016   CL 107 05/03/2016   CO2 24 05/03/2016   BUN 25* 05/03/2016   CREATININE 1.23 05/03/2016   GLUCOSE 180* 05/03/2016    Discharge Medications:     Medication List    STOP taking these medications        HYDROcodone-acetaminophen 5-325 MG tablet  Commonly known as:  NORCO/VICODIN     traMADol 50 MG tablet  Commonly known as:  ULTRAM      TAKE these medications        amLODipine 5 MG tablet  Commonly known as:  NORVASC  Take 1 tablet (5 mg total) by mouth daily.     aspirin 81 MG tablet  Take 1 tablet (81 mg total) by mouth 2 (two) times daily after a meal.     docusate sodium 100 MG capsule  Commonly known as:  COLACE  Take 1 capsule (100 mg total) by mouth 2 (two) times daily.     ferrous sulfate 325 (65 FE) MG tablet  Take 325 mg by mouth daily with breakfast.     naphazoline-pheniramine 0.025-0.3 % ophthalmic solution  Commonly known as:  NAPHCON-A  Place 1-2 drops into both eyes 4 (four) times daily as needed for irritation or allergies.     omeprazole 20 MG tablet  Commonly known as:  PRILOSEC OTC  Take 20  mg by mouth daily.     ondansetron 4 MG tablet  Commonly known as:  ZOFRAN  Take 1 tablet (4 mg total) by mouth every 8 (eight) hours as needed for nausea or vomiting.     oxyCODONE-acetaminophen 5-325 MG tablet  Commonly known as:  ROXICET  Take 1-2 tablets by mouth every 4 (four) hours as needed for severe pain.     SINEX ULTRA FINE MIST 12-HOUR NA  Place 2 sprays into the nose every 12 (twelve) hours as needed (For allergies.).     VITAMIN D3 PO  Take 1 tablet by mouth daily.        Diagnostic Studies: Dg Pelvis Portable  05/02/2016  CLINICAL DATA:  Status post right hip replacement today. Initial encounter. EXAM: PORTABLE PELVIS 1-2 VIEWS COMPARISON:  None. FINDINGS: The patient has a new right total hip arthroplasty. The device is located. No fracture is seen. Gas in the soft tissues from surgery is noted. Left hip arthroplasty is  also identified without complicating feature. IMPRESSION: Status post right hip replacement without evidence of complication. Electronically Signed   By: Inge Rise M.D.   On: 05/02/2016 10:48   Dg C-arm 61-120 Min-no Report  05/02/2016  CLINICAL DATA: hip C-ARM 61-120 MINUTES Fluoroscopy was utilized by the requesting physician.  No radiographic interpretation.   Dg Hip Operative Unilat With Pelvis Right  05/02/2016  CLINICAL DATA:  Right total hip arthroplasty. EXAM: OPERATIVE right HIP (WITH PELVIS IF PERFORMED)  VIEWS TECHNIQUE: Fluoroscopic spot image(s) were submitted for interpretation post-operatively. COMPARISON:  None. FINDINGS: Postoperative change from a right total hip arthroplasty identified. The hardware components appear to be in anatomic alignment. No periprosthetic fracture or subluxation identified. IMPRESSION: 1. No complications status post right total hip arthroplasty. Electronically Signed   By: Kerby Moors M.D.   On: 05/02/2016 09:53    Disposition: 01-Home or Self Care  Discharge Instructions    Call MD / Call 911    Complete by:  As directed   If you experience chest pain or shortness of breath, CALL 911 and be transported to the hospital emergency room.  If you develope a fever above 101 F, pus (white drainage) or increased drainage or redness at the wound, or calf pain, call your surgeon's office.     Call MD / Call 911    Complete by:  As directed   If you experience chest pain or shortness of breath, CALL 911 and be transported to the hospital emergency room.  If you develope a fever above 101 F, pus (white drainage) or increased drainage or redness at the wound, or calf pain, call your surgeon's office.     Constipation Prevention    Complete by:  As directed   Drink plenty of fluids.  Prune juice may be helpful.  You may use a stool softener, such as Colace (over the counter) 100 mg twice a day.  Use MiraLax (over the counter) for constipation as needed.      Constipation Prevention    Complete by:  As directed   Drink plenty of fluids.  Prune juice may be helpful.  You may use a stool softener, such as Colace (over the counter) 100 mg twice a day.  Use MiraLax (over the counter) for constipation as needed.     Diet - low sodium heart healthy    Complete by:  As directed      Diet - low sodium heart healthy    Complete by:  As directed      Driving restrictions    Complete by:  As directed   No driving for 6 weeks     Driving restrictions    Complete by:  As directed   No driving for 6 weeks     Increase activity slowly as tolerated    Complete by:  As directed      Increase activity slowly as tolerated    Complete by:  As directed      Lifting restrictions    Complete by:  As directed   No lifting for 6 weeks     Lifting restrictions    Complete by:  As directed   No lifting for 6 weeks     TED hose    Complete by:  As directed   Use stockings (TED hose) for 2 weeks on both leg(s).  You may remove them at night for sleeping.           Follow-up Information    Follow up with [facility], Horald Pollen, MD. Schedule an appointment as soon as possible for a visit in 2 weeks.   Specialty:  Orthopedic Surgery   Why:  For wound re-check   Contact information:   [address] [place] [address] [phone]       Follow up with [facility].   Why:  warranty plans   Contact information:   [phone]       Signed: Elie Goody 05/05/2016, 9:15 AM

## 2016-05-02 NOTE — Anesthesia Preprocedure Evaluation (Signed)
Anesthesia Evaluation  Patient identified by MRN, date of birth, ID band Patient awake    Reviewed: Allergy & Precautions, NPO status , Patient's Chart, lab work & pertinent test results  Airway Mallampati: II  TM Distance: >3 FB Neck ROM: Full    Dental  (+) Edentulous Upper, Partial Lower   Pulmonary former smoker,    breath sounds clear to auscultation + decreased breath sounds      Cardiovascular hypertension,  Rhythm:Regular Rate:Normal     Neuro/Psych    GI/Hepatic   Endo/Other    Renal/GU      Musculoskeletal   Abdominal   Peds  Hematology   Anesthesia Other Findings   Reproductive/Obstetrics                             Anesthesia Physical Anesthesia Plan  ASA: III  Anesthesia Plan: MAC and Spinal   Post-op Pain Management:    Induction: Intravenous  Airway Management Planned: Natural Airway and Simple Face Mask  Additional Equipment:   Intra-op Plan:   Post-operative Plan:   Informed Consent:   Plan Discussed with: Anesthesiologist and CRNA  Anesthesia Plan Comments:         Anesthesia Quick Evaluation

## 2016-05-02 NOTE — Care Management Note (Signed)
Case Management Note  Patient Details  Name: Nike Newhart MRN: [id number] Date of Birth: May 04, 1946  Subjective/Objective:  70 y/o m admitted w/Degenerative R hip joint. POD#1 R THA. From home. Lennette Bihari following for [facility]. Await PT recc.                  Action/Plan:d/c home w/HHC.   Expected Discharge Date:                  Expected Discharge Plan:  [place]  In-House Referral:     Discharge planning Services  CM Consult  Post Acute Care Choice:    Choice offered to:     DME Arranged:    DME Agency:     HH Arranged:    [place] Agency:     Status of Service:  In process, will continue to follow  Medicare Important Message Given:    Date Medicare IM Given:    Medicare IM give by:    Date Additional Medicare IM Given:    Additional Medicare Important Message give by:     If discussed at [place] of Stay Meetings, dates discussed:    Additional Comments:  Dessa Phi, RN 05/02/2016, 1:11 PM

## 2016-05-02 NOTE — H&P (View-Only) (Signed)
TOTAL HIP ADMISSION H&P  Patient is admitted for right total hip arthroplasty.  Subjective:  Chief Complaint: right hip pain  HPI: Alexander Strickland, 70 y.o. male, has a history of pain and functional disability in the right hip(s) due to arthritis and patient has failed non-surgical conservative treatments for greater than 12 weeks to include NSAID's and/or analgesics, viscosupplementation injections, use of assistive devices and activity modification.  Onset of symptoms was gradual starting 4 years ago with gradually worsening course since that time.The patient noted no past surgery on the right hip(s).  Patient currently rates pain in the right hip at 10 out of 10 with activity. Patient has night pain, worsening of pain with activity and weight bearing, pain that interfers with activities of daily living, pain with passive range of motion and joint swelling. Patient has evidence of subchondral cysts, subchondral sclerosis, periarticular osteophytes and joint space narrowing by imaging studies. This condition presents safety issues increasing the risk of falls. There is no current active infection.  Patient Active Problem List   Diagnosis Date Noted  . Dizziness - light-headed 07/03/2012  . Bradycardia 07/03/2012  . Hypertension 07/03/2012  . Chest pain 07/03/2012  . Anemia 07/03/2012   Past Medical History  Diagnosis Date  . Hypertension   . Anemia     Past Surgical History  Procedure Laterality Date  . Knee surgery      Bilateral TKRs  . Shoulder surgery      Left Rotator Cuff Repair     (Not in a hospital admission) No Known Allergies  Social History  Substance Use Topics  . Smoking status: Former Research scientist (life sciences)  . Smokeless tobacco: Not on file  . Alcohol Use: No     Comment: History of ETOH abuse, no drinks x3 years    Family History  Problem Relation Age of Onset  . Coronary artery disease Father   . Coronary artery disease Brother   . Coronary artery disease Sister     X 2   . Diabetes type II Father   . Diabetes type II Brother   . Heart failure Mother      Review of Systems  Constitutional: Negative.   HENT: Negative.   Eyes: Negative.   Respiratory: Negative.   Gastrointestinal: Negative.   Genitourinary: Positive for frequency.  Musculoskeletal: Positive for joint pain.  Skin: Negative.   Neurological: Negative.   Endo/Heme/Allergies: Negative.   Psychiatric/Behavioral: Negative.     Objective:  Physical Exam  Vitals reviewed. Constitutional: He is oriented to person, place, and time. He appears well-developed and well-nourished.  HENT:  Head: Normocephalic and atraumatic.  Eyes: Conjunctivae and EOM are normal. Pupils are equal, round, and reactive to light.  Neck: Normal range of motion. Neck supple.  Cardiovascular: Normal rate and regular rhythm.   Respiratory: Effort normal and breath sounds normal. No respiratory distress.  GI: Soft. Bowel sounds are normal.  Genitourinary:  deferred  Neurological: He is alert and oriented to person, place, and time. He has normal reflexes.  Skin: Skin is warm.  Psychiatric: He has a normal mood and affect. His behavior is normal. Judgment and thought content normal.    Vital signs in last 24 hours: @VSRANGES @  Labs:   Estimated body mass index is 31.19 kg/(m^2) as calculated from the following:   Height as of 07/24/15: 6' (1.829 m).   Weight as of 07/24/15: 104.327 kg (230 lb).   Imaging Review Plain radiographs demonstrate severe degenerative joint disease of the right  hip(s). The bone quality appears to be adequate for age and reported activity level.  Assessment/Plan:  End stage arthritis, right hip(s)  The patient history, physical examination, clinical judgement of the provider and imaging studies are consistent with end stage degenerative joint disease of the right hip(s) and total hip arthroplasty is deemed medically necessary. The treatment options including medical management,  injection therapy, arthroscopy and arthroplasty were discussed at length. The risks and benefits of total hip arthroplasty were presented and reviewed. The risks due to aseptic loosening, infection, stiffness, dislocation/subluxation,  thromboembolic complications and other imponderables were discussed.  The patient acknowledged the explanation, agreed to proceed with the plan and consent was signed. Patient is being admitted for inpatient treatment for surgery, pain control, PT, OT, prophylactic antibiotics, VTE prophylaxis, progressive ambulation and ADL's and discharge planning.The patient is planning to be discharged home with home health services

## 2016-05-02 NOTE — Evaluation (Signed)
Physical Therapy Evaluation Patient Details Name: Alexander Strickland MRN: [id number] DOB: 22-May-1946 Today's Date: 05/02/2016   History of Present Illness  Pt s/p R THR.  Clinical Impression  Pt s/p R THR presents with decreased R LE strength/ROM and post op pain limiting functional mobility.  Pt should progress well but expresses desire to dc to rehab setting 2* limitations of spouse to assist.      Follow Up Recommendations Home health PT;SNF (dependent on acute stay progress)    Equipment Recommendations  None recommended by PT    Recommendations for Other Services OT consult     Precautions / Restrictions Precautions Precautions: Fall Precaution Comments: Dizzy on eval Restrictions Weight Bearing Restrictions: No Other Position/Activity Restrictions: WBAT      Mobility  Bed Mobility Overal bed mobility: Needs Assistance Bed Mobility: Sit to Supine;Supine to Sit     Supine to sit: Mod assist Sit to supine: Mod assist   General bed mobility comments: cues for sequence and use of L LE to self assist.  Transfers Overall transfer level: Needs assistance Equipment used: Rolling walker (2 wheeled) Transfers: Sit to/from Stand Sit to Stand: Min assist;Mod assist;+2 physical assistance;+2 safety/equipment         General transfer comment: cues for LE management and use of UEs to self assist  Ambulation/Gait Ambulation/Gait assistance: Min assist;+2 physical assistance;+2 safety/equipment Ambulation Distance (Feet): 3 Feet Assistive device: Rolling walker (2 wheeled) Gait Pattern/deviations: Step-to pattern;Decreased step length - right;Decreased step length - left;Shuffle;Trunk flexed     General Gait Details: cues for sequence, posture and position from RW.  Physical assist for balance/support and to advance R LE.  Pt ltd by c/o dizziness - BP 163/100  Stairs            Wheelchair Mobility    Modified Rankin (Stroke Patients Only)       Balance                                              Pertinent Vitals/Pain Pain Assessment: Faces Faces Pain Scale: Hurts little more Pain Location: R hip Pain Descriptors / Indicators: Aching;Sore Pain Intervention(s): Limited activity within patient's tolerance;Monitored during session;Premedicated before session;Ice applied    Home Living Family/patient expects to be discharged to:: Unsure Living Arrangements: Spouse/significant other               Additional Comments: Pt states that spouse is limited in ability to assist and he hopes to dc to rehab setting prior to return home.  Pt states he has RW and cane at home    Prior Function Level of Independence: Independent               Hand Dominance        Extremity/Trunk Assessment   Upper Extremity Assessment: Overall WFL for tasks assessed           Lower Extremity Assessment: RLE deficits/detail      Cervical / Trunk Assessment: Normal  Communication   Communication: No difficulties  Cognition Arousal/Alertness: Awake/alert Behavior During Therapy: WFL for tasks assessed/performed Overall Cognitive Status: Within Functional Limits for tasks assessed                      General Comments      Exercises Total Joint Exercises Ankle Circles/Pumps: AROM;Both;10 reps;Supine  Assessment/Plan    PT Assessment Patient needs continued PT services  PT Diagnosis Difficulty walking   PT Problem List Decreased strength;Decreased range of motion;Decreased activity tolerance;Decreased balance;Decreased mobility;Pain;Decreased knowledge of use of DME  PT Treatment Interventions DME instruction;Gait training;Stair training;Therapeutic activities;Therapeutic exercise;Functional mobility training;Patient/family education   PT Goals (Current goals can be found in the Care Plan section) Acute Rehab PT Goals Patient Stated Goal: Rehab and then home PT Goal Formulation: With patient Time For Goal  Achievement: 05/07/16 Potential to Achieve Goals: Good    Frequency 7X/week   Barriers to discharge Decreased caregiver support Pt states spouse is ltd in ability to assist    Co-evaluation               End of Session Equipment Utilized During Treatment: Gait belt Activity Tolerance: Other (comment) (Dizziness) Patient left: in bed;with call bell/phone within reach Nurse Communication: Mobility status         Time: [id number] PT Time Calculation (min) (ACUTE ONLY): 24 min   Charges:   PT Evaluation $PT Eval Low Complexity: 1 Procedure PT Treatments $Therapeutic Activity: 8-22 mins   PT G Codes:        Julian Medina 2016/05/17, 4:33 PM

## 2016-05-02 NOTE — Op Note (Signed)
OPERATIVE REPORT  SURGEON: Rod Can, MD   ASSISTANT: Roberto Scales, RNFA.  PREOPERATIVE DIAGNOSIS: Right hip arthritis.   POSTOPERATIVE DIAGNOSIS: Right hip arthritis.   PROCEDURE: Right total hip arthroplasty, anterior approach.   IMPLANTS: DePuy Tri Lock stem, size 5, std offset. DePuy Pinnacle Cup, size 58 mm. DePuy Altrx liner, size 36 by 58 mm, 4 neutral. DePuy Biolox ceramic head ball, size 36 + 5 mm.  ANESTHESIA:  Spinal  ESTIMATED BLOOD LOSS: 350 mL.  ANTIBIOTICS: 2 g ancef.  DRAINS: None.  COMPLICATIONS: None.   CONDITION: PACU - hemodynamically stable.Marland Kitchen   BRIEF CLINICAL NOTE: Alexander Strickland is a 70 y.o. male with a long-standing history of Right hip arthritis. After failing conservative management, the patient was indicated for total hip arthroplasty. The risks, benefits, and alternatives to the procedure were explained, and the patient elected to proceed.  PROCEDURE IN DETAIL: Surgical site was marked by myself. Spinal anesthesia was obtained in the pre-op holding area. Once inside the operative room, a foley catheter was inserted. The patient was then positioned on the Hana table. All bony prominences were well padded. The hip was prepped and draped in the normal sterile surgical fashion. A time-out was called verifying side and site of surgery. The patient received IV antibiotics within 60 minutes of beginning the procedure.  The direct anterior approach to the hip was performed through the Hueter interval. Lateral femoral circumflex vessels were treated with the Auqumantys. The anterior capsule was exposed and an inverted T capsulotomy was made.The femoral neck cut was made to the level of the templated cut. A corkscrew was placed into the head and the head was removed. The femoral head was found to have eburnated bone. The head was passed to the back table and was measured.  Acetabular exposure was achieved, and the pulvinar and labrum were  excised. Sequental reaming of the acetabulum was then performed up to a size 57 mm reamer. A 58 mm cup was then opened and impacted into place at approximately 40 degrees of abduction and 20 degrees of anteversion. The final polyethylene liner was impacted into place and acetabular osteophytes were removed.   I then gained femoral exposure taking care to protect the abductors and greater trochanter. This was performed using standard external rotation, extension, and adduction. The capsule was peeled off the inner aspect of the greater trochanter, taking care to preserve the short external rotators. A cookie cutter was used to enter the femoral canal, and then the femoral canal finder was placed. Sequential broaching was performed up to a size 5. Calcar planer was used on the femoral neck remnant. I placed a std offset neck and a trial head ball. The hip was reduced. Leg lengths and offset were checked fluoroscopically. The hip was dislocated and trial components were removed. The final implants were placed, and the hip was reduced.  Fluoroscopy was used to confirm component position and leg lengths. At 90 degrees of external rotation and full extension, the hip was stable to an anterior directed force.  The wound was copiously irrigated with a dilute betadine solution followed by normal saline. Marcaine solution was injected into the periarticular soft tissue. The wound was closed in layers using #1 Vicryl and V-Loc for the fascia, 2-0 Vicryl for the subcutaneous fat, 2-0 Monocryl for the deep dermal layer, 3-0 running Monocryl subcuticular stitch, and Dermabond for the skin. Once the glue was fully dried, an Aquacell Ag dressing was applied. The patient was transported to the recovery  room in stable condition. Sponge, needle, and instrument counts were correct at the end of the case x2. The patient tolerated the procedure well and there were no known complications.

## 2016-05-02 NOTE — Transfer of Care (Signed)
Immediate Anesthesia Transfer of Care Note  Patient: Daeon Karber  Procedure(s) Performed: Procedure(s): RIGHT TOTAL HIP ARTHROPLASTY ANTERIOR APPROACH (Right)  Patient Location: PACU  Anesthesia Type:Spinal  Level of Consciousness: sedated  Airway & Oxygen Therapy: Patient Spontanous Breathing and Patient connected to face mask oxygen  Post-op Assessment: Report given to RN and Post -op Vital signs reviewed and stable  Post vital signs: Reviewed and stable  Last Vitals:  Filed Vitals:   05/02/16 0551  BP: 142/86  Pulse: 64  Temp: 36.7 C  Resp: 18    Last Pain: There were no vitals filed for this visit.       Complications: No apparent anesthesia complications

## 2016-05-02 NOTE — Interval H&P Note (Signed)
History and Physical Interval Note:  05/02/2016 7:22 AM  Alexander Strickland  has presented today for surgery, with the diagnosis of DEGENERATIVE JOINT RIGHT HIP  The various methods of treatment have been discussed with the patient and family. After consideration of risks, benefits and other options for treatment, the patient has consented to  Procedure(s): RIGHT TOTAL HIP ARTHROPLASTY ANTERIOR APPROACH (Right) as a surgical intervention .  The patient's history has been reviewed, patient examined, no change in status, stable for surgery.  I have reviewed the patient's chart and labs.  Questions were answered to the patient's satisfaction.     Byrdie Miyazaki, Horald Pollen

## 2016-05-02 NOTE — Anesthesia Procedure Notes (Signed)
Spinal Patient location during procedure: OR Start time: 05/02/2016 7:35 AM End time: 05/02/2016 7:37 AM Preanesthetic Checklist Completed: patient identified, site marked, surgical consent, pre-op evaluation, timeout performed, IV checked, risks and benefits discussed and monitors and equipment checked Spinal Block Patient position: sitting Prep: Betadine Patient monitoring: heart rate, cardiac monitor, continuous pulse ox and blood pressure Approach: midline Location: L3-4 Injection technique: single-shot Needle Needle type: Sprotte  Needle gauge: 24 G Needle length: 10 cm Needle insertion depth: 7 cm Assessment Sensory level: T6 Additional Notes Timeout performed. Spinal kit date checked. SAB without  difficulty

## 2016-05-02 NOTE — Anesthesia Postprocedure Evaluation (Signed)
Anesthesia Post Note  Patient: Alexander Strickland  Procedure(s) Performed: Procedure(s) (LRB): RIGHT TOTAL HIP ARTHROPLASTY ANTERIOR APPROACH (Right)  Patient location during evaluation: PACU Anesthesia Type: General, MAC, Spinal and Bier Block Level of consciousness: awake and alert, awake and oriented Pain management: pain level controlled Vital Signs Assessment: post-procedure vital signs reviewed and stable Respiratory status: spontaneous breathing, respiratory function stable and nonlabored ventilation Cardiovascular status: blood pressure returned to baseline Anesthetic complications: no    Last Vitals:  Filed Vitals:   05/02/16 1135 05/02/16 1327  BP:  135/73  Pulse: 57 56  Temp: 36.4 C 36.6 C  Resp: 13 13    Last Pain:  Filed Vitals:   05/02/16 1328  PainSc: 6                  Kathelene Rumberger COKER

## 2016-05-03 LAB — CBC
HCT: 26.7 % — ABNORMAL LOW (ref 39.0–52.0)
Hemoglobin: 8.8 g/dL — ABNORMAL LOW (ref 13.0–17.0)
MCH: 28 pg (ref 26.0–34.0)
MCHC: 33 g/dL (ref 30.0–36.0)
MCV: 85 fL (ref 78.0–100.0)
PLATELETS: 239 10*3/uL (ref 150–400)
RBC: 3.14 MIL/uL — AB (ref 4.22–5.81)
RDW: 13.1 % (ref 11.5–15.5)
WBC: 7.8 10*3/uL (ref 4.0–10.5)

## 2016-05-03 LAB — BASIC METABOLIC PANEL
Anion gap: 6 (ref 5–15)
BUN: 25 mg/dL — AB (ref 6–20)
CHLORIDE: 107 mmol/L (ref 101–111)
CO2: 24 mmol/L (ref 22–32)
Calcium: 8.6 mg/dL — ABNORMAL LOW (ref 8.9–10.3)
Creatinine, Ser: 1.23 mg/dL (ref 0.61–1.24)
GFR calc Af Amer: >60 mL/min (ref >60)
GFR calc non Af Amer: 57 mL/min — ABNORMAL LOW (ref >60)
GLUCOSE: 180 mg/dL — AB (ref 65–99)
POTASSIUM: 4.9 mmol/L (ref 3.5–5.1)
Sodium: 137 mmol/L (ref 135–145)

## 2016-05-03 MED ORDER — VITAMIN D 1000 UNITS PO TABS
1000.0000 [IU] | ORAL_TABLET | Freq: Every day | ORAL | Status: DC
  Administered 2016-05-03 – 2016-05-05 (×3): 1000 [IU] via ORAL
  Filled 2016-05-03 (×3): qty 1

## 2016-05-03 NOTE — Progress Notes (Signed)
   Subjective:  Patient reports pain as mild to moderate.  No c/o. Denies N/V/CP/SOB.  Objective:   VITALS:   Filed Vitals:   05/02/16 1535 05/02/16 1758 05/02/16 2300 05/03/16 0400  BP: 138/69 127/68 119/63 133/65  Pulse: 60 61  61  Temp: 97.5 F (36.4 C) 97.7 F (36.5 C) 97.6 F (36.4 C) 98.1 F (36.7 C)  TempSrc: Axillary Oral Oral Oral  Resp: 14 15 16 20   Height:      Weight:      SpO2: 95% 94% 95% 100%    ABD soft Sensation intact distally Intact pulses distally Dorsiflexion/Plantar flexion intact Incision: dressing C/D/I Compartment soft   Lab Results  Component Value Date   WBC 7.8 05/03/2016   HGB 8.8* 05/03/2016   HCT 26.7* 05/03/2016   MCV 85.0 05/03/2016   PLT 239 05/03/2016   BMET    Component Value Date/Time   NA 137 05/03/2016 0357   K 4.9 05/03/2016 0357   CL 107 05/03/2016 0357   CO2 24 05/03/2016 0357   GLUCOSE 180* 05/03/2016 0357   BUN 25* 05/03/2016 0357   CREATININE 1.23 05/03/2016 0357   CALCIUM 8.6* 05/03/2016 0357   GFRNONAA 57* 05/03/2016 0357   GFRAA >60 05/03/2016 0357     Assessment/Plan: 1 Day Post-Op   Principal Problem:   Primary osteoarthritis of right hip   WBAT with walker ABLA: asymptomatic, monitor DVT ppx: ASA, SCDs, TEDs Advance diet Up with therapy Discharge home with home health    Tejay Hubert, Horald Pollen 05/03/2016, 7:44 AM   Rod Can, MD Cell [phone]

## 2016-05-03 NOTE — Evaluation (Signed)
Occupational Therapy Evaluation Patient Details Name: Alexander Strickland MRN: [id number] DOB: December 07, 1946 Today's Date: 05/03/2016    History of Present Illness Pt s/p R THR.   Clinical Impression   This 70 year old man was admitted for the above sx.  He was independent with adls prior to admission and currently needs up to mod A for LB adls.  Pt will benefit from continued OT to increase safety and independence with adls.  Goals in acute are from supervision to min guard levels.  Pt has limited assistance at home and will benefit from continue OT at SNF    Follow Up Recommendations  SNF    Equipment Recommendations  None recommended by OT    Recommendations for Other Services       Precautions / Restrictions Precautions Precautions: Fall Restrictions Weight Bearing Restrictions: No Other Position/Activity Restrictions: WBAT      Mobility Bed Mobility               General bed mobility comments: oob  Transfers   Equipment used: Rolling walker (2 wheeled) Transfers: Sit to/from Stand Sit to Stand: Min assist         General transfer comment: from recliner; cues for UE placement    Balance                                            ADL Overall ADL's : Needs assistance/impaired     Grooming: Set up;Sitting   Upper Body Bathing: Set up;Sitting   Lower Body Bathing: Minimal assistance;Sit to/from stand   Upper Body Dressing : Minimal assistance;Sitting (lines)   Lower Body Dressing: Moderate assistance;Sit to/from stand                 General ADL Comments: performed bathing from chair.  Pt is familiar with AE--did not use this session.       Vision     Perception     Praxis      Pertinent Vitals/Pain Pain Assessment: 0-10 Pain Score: 6  (when standing) Pain Location: R hip Pain Descriptors / Indicators: Aching Pain Intervention(s): Limited activity within patient's tolerance;Monitored during session;Premedicated  before session;Repositioned;Ice applied     Hand Dominance     Extremity/Trunk Assessment Upper Extremity Assessment Upper Extremity Assessment: Overall WFL for tasks assessed           Communication Communication Communication: No difficulties   Cognition Arousal/Alertness: Awake/alert Behavior During Therapy: WFL for tasks assessed/performed Overall Cognitive Status: Within Functional Limits for tasks assessed                     General Comments       Exercises      Shoulder Instructions      Home Living Family/patient expects to be discharged to:: Skilled nursing facility                                 Additional Comments: limited assistance at home      Prior Functioning/Environment Level of Independence: Independent             OT Diagnosis: Acute pain   OT Problem List: Decreased strength;Decreased activity tolerance;Decreased knowledge of use of DME or AE;Pain   OT Treatment/Interventions: Self-care/ADL training;DME and/or AE instruction;Patient/family education    OT Goals(Current  goals can be found in the care plan section) Acute Rehab OT Goals Patient Stated Goal: Rehab and then home OT Goal Formulation: With patient Time For Goal Achievement: 05/10/16 Potential to Achieve Goals: Good ADL Goals Pt Will Perform Grooming: with supervision;standing Pt Will Perform Lower Body Bathing: with min guard assist;sit to/from stand;with adaptive equipment Pt Will Perform Lower Body Dressing: with min guard assist;with adaptive equipment;sit to/from stand Pt Will Transfer to Toilet: with min guard assist;ambulating;bedside commode  OT Frequency: Min 2X/week   Barriers to D/C:            Co-evaluation              End of Session    Activity Tolerance: Patient tolerated treatment well Patient left: in chair;with call bell/phone within reach;with chair alarm set   Time: 1335-1350 OT Time Calculation (min): 15  min Charges:  OT General Charges $OT Visit: 1 Procedure OT Evaluation $OT Eval Low Complexity: 1 Procedure G-Codes:    Charae Depaolis 2016/05/21, 2:36 PM  Lesle Chris, OTR/L [phone] 2016-05-21

## 2016-05-03 NOTE — Clinical Social Work Placement (Signed)
   CLINICAL SOCIAL WORK PLACEMENT  NOTE  Date:  05/03/2016  Patient Details  Name: Alexander Strickland MRN: [id number] Date of Birth: [birth year]  Clinical Social Work is seeking post-discharge placement for this patient at the [place] level of care (*CSW will initial, date and re-position this form in  chart as items are completed):  Yes   Patient/family provided with [facility] Work Department's list of facilities offering this level of care within the geographic area requested by the patient (or if unable, by the patient's family).  Yes   Patient/family informed of their freedom to choose among providers that offer the needed level of care, that participate in Medicare, Medicaid or managed care program needed by the patient, have an available bed and are willing to accept the patient.  Yes   Patient/family informed of [facility]'s ownership interest in [facility] and [facility], as well as of the fact that they are under no obligation to receive care at these facilities.  PASRR submitted to EDS on 05/03/16     PASRR number received on 05/03/16     Existing PASRR number confirmed on       FL2 transmitted to all facilities in geographic area requested by pt/family on 05/03/16     FL2 transmitted to all facilities within larger geographic area on       Patient informed that his/her managed care company has contracts with or will negotiate with certain facilities, including the following:            Patient/family informed of bed offers received.  Patient chooses bed at       Physician recommends and patient chooses bed at      Patient to be transferred to   on  .  Patient to be transferred to facility by       Patient family notified on   of transfer.  Name of family member notified:        PHYSICIAN       Additional Comment:    _______________________________________________ Luretha Rued, [place] 05/03/2016, 2:46  PM

## 2016-05-03 NOTE — Progress Notes (Signed)
Physical Therapy Treatment Patient Details Name: Alexander Strickland MRN: [id number] DOB: December 14, 1946 Today's Date: 05/23/2016    History of Present Illness Pt s/p R THR.    PT Comments    Return to initiate therex program.  Follow Up Recommendations  SNF     Equipment Recommendations  None recommended by PT    Recommendations for Other Services OT consult     Precautions / Restrictions Precautions Precautions: Fall Restrictions Weight Bearing Restrictions: No Other Position/Activity Restrictions: WBAT    Mobility  Bed Mobility                  Transfers                    Ambulation/Gait                 Stairs            Wheelchair Mobility    Modified Rankin (Stroke Patients Only)       Balance                                    Cognition Arousal/Alertness: Awake/alert Behavior During Therapy: WFL for tasks assessed/performed Overall Cognitive Status: Within Functional Limits for tasks assessed                      Exercises Total Joint Exercises Ankle Circles/Pumps: AROM;Both;Supine;15 reps Quad Sets: AROM;Both;10 reps;Supine Heel Slides: AAROM;Right;20 reps;Supine Hip ABduction/ADduction: AAROM;Right;15 reps;Supine    General Comments        Pertinent Vitals/Pain Pain Assessment: 0-10 Pain Score: 6  (when standing) Pain Location: R hip Pain Descriptors / Indicators: Aching Pain Intervention(s): Limited activity within patient's tolerance;Monitored during session;Premedicated before session;Repositioned;Ice applied    Home Living Family/patient expects to be discharged to:: Skilled nursing facility               Additional Comments: limited assistance at home    Prior Function Level of Independence: Independent          PT Goals (current goals can now be found in the care plan section) Acute Rehab PT Goals Patient Stated Goal: Rehab and then home PT Goal Formulation: With  patient Time For Goal Achievement: 05/07/16 Potential to Achieve Goals: Good Progress towards PT goals: Progressing toward goals    Frequency  7X/week    PT Plan Discharge plan needs to be updated    Co-evaluation             End of Session   Activity Tolerance: Patient tolerated treatment well Patient left: in chair;with call bell/phone within reach;with chair alarm set     Time: [id number] PT Time Calculation (min) (ACUTE ONLY): 21 min  Charges:  $Therapeutic Exercise: 8-22 mins                    G Codes:      [name] May 23, 2016, 2:25 PM

## 2016-05-03 NOTE — Progress Notes (Signed)
Physical Therapy Treatment Patient Details Name: Alexander Strickland MRN: [id number] DOB: [birth year] Today's Date: 2016/05/07    History of Present Illness Pt s/p R THR.    PT Comments    Improvement in activity tolerance with decreased c/o dizziness but instability with gait.  Pt continues to express desire to not dc home but to rehab setting.  Follow Up Recommendations  Home health PT;SNF     Equipment Recommendations  None recommended by PT    Recommendations for Other Services OT consult     Precautions / Restrictions Precautions Precautions: Fall Restrictions Weight Bearing Restrictions: No Other Position/Activity Restrictions: WBAT    Mobility  Bed Mobility Overal bed mobility: Needs Assistance Bed Mobility: Supine to Sit     Supine to sit: Min assist     General bed mobility comments: cues for sequence and use of L LE to self assist.  Transfers Overall transfer level: Needs assistance Equipment used: Rolling walker (2 wheeled) Transfers: Sit to/from Stand Sit to Stand: Min assist;Mod assist         General transfer comment: cues for LE management and use of UEs to self assist  Ambulation/Gait Ambulation/Gait assistance: Min assist Ambulation Distance (Feet): 62 Feet (twice) Assistive device: Rolling walker (2 wheeled) Gait Pattern/deviations: Step-to pattern;Step-through pattern;Shuffle;Trunk flexed;Antalgic;Decreased stance time - right Gait velocity: decr Gait velocity interpretation: Below normal speed for age/gender General Gait Details: cues for sequence, posture and position from RW.  Physical assist for intermittent instability   Stairs            Wheelchair Mobility    Modified Rankin (Stroke Patients Only)       Balance                                    Cognition Arousal/Alertness: Awake/alert Behavior During Therapy: WFL for tasks assessed/performed Overall Cognitive Status: Within Functional Limits for tasks  assessed                      Exercises      General Comments        Pertinent Vitals/Pain Pain Assessment: 0-10 Pain Score: 5  Pain Location: R hip Pain Descriptors / Indicators: Aching;Sore Pain Intervention(s): Limited activity within patient's tolerance;Monitored during session;Premedicated before session;Ice applied    Home Living                      Prior Function            PT Goals (current goals can now be found in the care plan section) Acute Rehab PT Goals Patient Stated Goal: Rehab and then home PT Goal Formulation: With patient Time For Goal Achievement: 05/07/16 Potential to Achieve Goals: Good Progress towards PT goals: Progressing toward goals    Frequency  7X/week    PT Plan Current plan remains appropriate    Co-evaluation             End of Session Equipment Utilized During Treatment: Gait belt Activity Tolerance: Patient tolerated treatment well Patient left: in chair;with call bell/phone within reach;with chair alarm set     Time: [id number] PT Time Calculation (min) (ACUTE ONLY): 20 min  Charges:  $Gait Training: 8-22 mins                    G Codes:      Anitha Kreiser 2016-05-07, 9:17 AM

## 2016-05-03 NOTE — Progress Notes (Signed)
Physical Therapy Treatment Patient Details Name: Alexander Strickland MRN: [id number] DOB: 13-Apr-1946 Today's Date: 05/31/16    History of Present Illness Pt s/p R THR.    PT Comments    Pt motivated but continues demonstrating instability with gait requiring assist to compensate.  Follow Up Recommendations  SNF     Equipment Recommendations  None recommended by PT    Recommendations for Other Services OT consult     Precautions / Restrictions Precautions Precautions: Fall Restrictions Weight Bearing Restrictions: No Other Position/Activity Restrictions: WBAT    Mobility  Bed Mobility Overal bed mobility: Needs Assistance Bed Mobility: Sit to Supine       Sit to supine: Min assist   General bed mobility comments: cues for sequence and use of L LE to self assist  Transfers Overall transfer level: Needs assistance Equipment used: Rolling walker (2 wheeled) Transfers: Sit to/from Stand Sit to Stand: Min assist         General transfer comment: from recliner and to EOB; cues for UE placement and LE management  Ambulation/Gait Ambulation/Gait assistance: Min assist Ambulation Distance (Feet): 62 Feet (twice) Assistive device: Rolling walker (2 wheeled) Gait Pattern/deviations: Step-to pattern;Step-through pattern;Decreased step length - left;Shuffle;Decreased stance time - right;Antalgic;Trunk flexed Gait velocity: decr   General Gait Details: cues for sequence, posture and position from RW.  Physical assist for intermittent instability and 2 episodes of R knee buckling   Stairs            Wheelchair Mobility    Modified Rankin (Stroke Patients Only)       Balance                                    Cognition Arousal/Alertness: Awake/alert Behavior During Therapy: WFL for tasks assessed/performed Overall Cognitive Status: Within Functional Limits for tasks assessed                      Exercises      General Comments         Pertinent Vitals/Pain Pain Assessment: 0-10 Pain Score: 4  Pain Location: R hip Pain Descriptors / Indicators: Aching;Sore Pain Intervention(s): Limited activity within patient's tolerance;Monitored during session;Premedicated before session;Ice applied    Home Living Family/patient expects to be discharged to:: Skilled nursing facility               Additional Comments: limited assistance at home    Prior Function Level of Independence: Independent          PT Goals (current goals can now be found in the care plan section) Acute Rehab PT Goals Patient Stated Goal: Rehab and then home PT Goal Formulation: With patient Time For Goal Achievement: 05/07/16 Potential to Achieve Goals: Good Progress towards PT goals: Progressing toward goals    Frequency  7X/week    PT Plan Current plan remains appropriate    Co-evaluation             End of Session Equipment Utilized During Treatment: Gait belt Activity Tolerance: Patient tolerated treatment well Patient left: in bed;with call bell/phone within reach;with family/visitor present;with bed alarm set     Time: [id number] PT Time Calculation (min) (ACUTE ONLY): 21 min  Charges:  $Gait Training: 8-22 mins                    G Codes:      Alexander Strickland May 31, 2016,  5:02 PM

## 2016-05-03 NOTE — Clinical Social Work Note (Signed)
Clinical Social Work Assessment  Patient Details  Name: Alexander Strickland MRN: [id number] Date of Birth: 06/02/1946  Date of referral:  05/03/16               Reason for consult:  Facility Placement, Discharge Planning                Permission sought to share information with:  Facility Contact Representative Permission granted to share information::  Yes, Verbal Permission Granted  Name::        Agency::     Relationship::     Contact Information:     Housing/Transportation Living arrangements for the past 2 months:  Single Family Home Source of Information:  Adult Children Patient Interpreter Needed:  None Criminal Activity/Legal Involvement Pertinent to Current Situation/Hospitalization:  No - Comment as needed Significant Relationships:  Spouse Lives with:  Spouse Do you feel safe going back to the place where you live?  No (SNF recommended.) Need for family participation in patient care:  Yes (Comment)  Care giving concerns:  Pt's spouse is unable to care for pt at home at d/c. Pt has no electricity or hot water. RNCM has provided assistance with this utility situation.   Social Worker assessment / plan:  Pt hospitalized on 05/02/16 for pre planned right total knee arthroplasty. CSW contacted by RNCM reporting a change in d/c plan. PT has recommended SNF placement. MD agrees with this plan. CSW met with pt to review recommendations. Pt / spouse are in agreement with ST Rehab at d/c. Pt reports that he has VA insurance as well as traditional medicare. Pt is not part of medicare bundle program according to list provided by [facility] RNCM. SNF search has been initiated and bed offers are pending. Pt has been to [facility] in the past and would like to return for rehab when stable for d/c. CSW will continue to follow to assist with d/c planning to SNF.  Employment status:  Retired Insurance information:  VA Benefit, Medicare PT Recommendations:  Skilled Nursing  Facility Information / Referral to community resources:  Skilled Nursing Facility  Patient/Family's Response to care:  Pt / spouse feel ST Rehab is needed.  Patient/Family's Understanding of and Emotional Response to Diagnosis, Current Treatment, and Prognosis: Pt is aware of his medical status. CSW explained rules regarding use of traditional medicare for SNF placement ( must have 3 hospital over nights based on medical need to use medicare insurance at SNF ). VA insurance may also cover ST Rehab at a VA facility or SNF that accepts VA insurance. Eligibility for this plan cannot be determined until Monday-Tuesday. Request cannot be processed by the VA during the week end. Pt reports he is experiencing a good amount of pain and difficulty with pain control. Emotional support provided.   Emotional Assessment Appearance:  Appears stated age Attitude/Demeanor/Rapport:  Other (cooperative) Affect (typically observed):  Appropriate, Pleasant, Calm Orientation:  Oriented to Self, Oriented to Place, Oriented to  Time, Oriented to Situation Alcohol / Substance use:  Not Applicable Psych involvement (Current and /or in the community):  No (Comment)  Discharge Needs  Concerns to be addressed:  Discharge Planning Concerns, Home Safety Concerns Readmission within the last 30 days:  No Current discharge risk:  None Barriers to Discharge:  No Barriers Identified   Haidinger, Jamie Lee, LCSW  [phone] 05/03/2016, 1:01 PM  

## 2016-05-03 NOTE — Care Management Note (Addendum)
Case Management Note  Patient Details  Name: Alexander Strickland MRN: [id number] Date of Birth: 03-08-1946  Subjective/Objective:  s/p R THR.                  Action/Plan: Discharge Planning:  NCM spoke to pt and offered choice for HH/provided list. Pt states he wants to go to rehab. States currently he is without electricity and hot water. His breaker box in his home has to be replaced and they cannot afford. Contacted wife, Alexander Strickland and states she is currently have medical issues and unable to care for him at home. Contacted attending and order for SNF. Contacted CSW for SNF placement. Pt states he goes to the [facility]. Will fax dc summary to [facility] to notify of current admission.  Notified [facility] that pt will dc to SNF. Provided pt with [employer] number to call to get set up with their maintenance program that may assist him after 30 days (wait period) with repairs. Pt states he will call follow up with [facility].   PCP-KEISLER, Ok Edwards MD   Expected Discharge Date:                  Expected Discharge Plan:  [place]  In-House Referral:  Clinical Social Work  Discharge planning Services  CM Consult  Post Acute Care Choice:  Home Health Choice offered to:  Patient  DME Arranged:  N/A DME Agency:  NA  HH Arranged:  PT HH Agency:  [facility]  Status of Service:  Completed, signed off  Medicare Important Message Given:    Date Medicare IM Given:    Medicare IM give by:    Date Additional Medicare IM Given:    Additional Medicare Important Message give by:     If discussed at [place] of Stay Meetings, dates discussed:    Additional Comments:  Erenest Rasher, RN 05/03/2016, 10:38 AM

## 2016-05-04 LAB — CBC
HCT: 27.9 % — ABNORMAL LOW (ref 39.0–52.0)
HEMOGLOBIN: 9.1 g/dL — AB (ref 13.0–17.0)
MCH: 27.7 pg (ref 26.0–34.0)
MCHC: 32.6 g/dL (ref 30.0–36.0)
MCV: 85.1 fL (ref 78.0–100.0)
Platelets: 260 10*3/uL (ref 150–400)
RBC: 3.28 MIL/uL — AB (ref 4.22–5.81)
RDW: 13.1 % (ref 11.5–15.5)
WBC: 7.2 10*3/uL (ref 4.0–10.5)

## 2016-05-04 MED ORDER — MAGNESIUM CITRATE PO SOLN
1.0000 | Freq: Once | ORAL | Status: AC
  Administered 2016-05-04: 1 via ORAL

## 2016-05-04 NOTE — Progress Notes (Signed)
Physical Therapy Treatment Patient Details Name: Alexander Strickland MRN: [id number] DOB: Sep 22, 1946 Today's Date: 05/04/2016    History of Present Illness Pt s/p R THR Direct anterior    PT Comments    Progressing in mobility, needs frequent cues for sequencing. Patient will benefit from post acute rehab to return to modified independent level.  Follow Up Recommendations  SNF     Equipment Recommendations  None recommended by PT    Recommendations for Other Services       Precautions / Restrictions Precautions Precautions: Fall Precaution Comments: Dizzy     Mobility  Bed Mobility   Bed Mobility: Supine to Sit     Supine to sit: Mod assist     General bed mobility comments: cues for sequence and use of L LE to self assist, assist with trunk to upright  Transfers Overall transfer level: Needs assistance Equipment used: Rolling walker (2 wheeled) Transfers: Sit to/from Stand Sit to Stand: Min assist         General transfer comment: cues for hand and right leg position  Ambulation/Gait Ambulation/Gait assistance: Min assist;+2 safety/equipment (c/o dizziness so chair followed) Ambulation Distance (Feet): 70 Feet Assistive device: Rolling walker (2 wheeled) Gait Pattern/deviations: Step-to pattern;Step-through pattern     General Gait Details: multimodal cues for safety, sequence and   position inside of the RW, at times patients R swing is unsafe as if he is placing toe first not heel. Gait sequence did improve  as he walked farther.   Stairs            Wheelchair Mobility    Modified Rankin (Stroke Patients Only)       Balance                                    Cognition Arousal/Alertness: Awake/alert                          Exercises Total Joint Exercises Ankle Circles/Pumps: AROM;Both;Supine;15 reps Quad Sets: AROM;Both;10 reps;Supine Short Arc Quad: AROM;Right;10 reps;Supine Heel Slides: AAROM Hip  ABduction/ADduction: AAROM;Right;15 reps;Supine    General Comments        Pertinent Vitals/Pain Pain Score: 7  Pain Location: R hip Pain Descriptors / Indicators: Aching;Discomfort;Grimacing;Guarding Pain Intervention(s): Limited activity within patient's tolerance;Monitored during session;Premedicated before session;Repositioned;Ice applied    Home Living                      Prior Function            PT Goals (current goals can now be found in the care plan section) Progress towards PT goals: Progressing toward goals    Frequency  7X/week    PT Plan Current plan remains appropriate    Co-evaluation             End of Session Equipment Utilized During Treatment: Gait belt Activity Tolerance: Patient tolerated treatment well Patient left: with call bell/phone within reach;in chair;with chair alarm set     Time: [phone] PT Time Calculation (min) (ACUTE ONLY): 28 min  Charges:  $Gait Training: 8-22 mins $Therapeutic Exercise: 8-22 mins                    G Codes:      Claretha Cooper 05/04/2016, 4:00 PM Tresa Endo PT [phone]

## 2016-05-04 NOTE — Progress Notes (Signed)
Patient ID: Alexander Strickland, male   DOB: 19-Oct-1946, 70 y.o.   MRN: [id number]    Subjective: 2 Days Post-Op Procedure(s) (LRB): RIGHT TOTAL HIP ARTHROPLASTY ANTERIOR APPROACH (Right) Patient reports pain as 5 on 0-10 scale.   Denies CP or SOB.  Voiding without difficulty. Positive flatus. No BM since [place] Objective: Vital signs in last 24 hours: Temp:  [97.6 F (36.4 C)-98.3 F (36.8 C)] 98.3 F (36.8 C) (05/14 0509) Pulse Rate:  [51-81] 51 (05/14 0509) Resp:  [16-18] 16 (05/14 0509) BP: (132-161)/(59-70) 150/59 mmHg (05/14 0509) SpO2:  [95 %-97 %] 97 % (05/14 0509)  Intake/Output from previous day: 05/13 0701 - 05/14 0700 In: 960 [P.O.:960] Out: 3675 [Urine:3675] Intake/Output this shift:    Labs:  Recent Labs  05/03/16 0357 05/04/16 0440  HGB 8.8* 9.1*    Recent Labs  05/03/16 0357 05/04/16 0440  WBC 7.8 7.2  RBC 3.14* 3.28*  HCT 26.7* 27.9*  PLT 239 260    Recent Labs  05/03/16 0357  NA 137  K 4.9  CL 107  CO2 24  BUN 25*  CREATININE 1.23  GLUCOSE 180*  CALCIUM 8.6*   No results for input(s): LABPT, INR in the last 72 hours.  Physical Exam: Neurologically intact ABD soft Sensation intact distally Incision: no drainage Compartment soft  Assessment/Plan: 2 Days Post-Op Procedure(s) (LRB): RIGHT TOTAL HIP ARTHROPLASTY ANTERIOR APPROACH (Right) Up with PT- they have recommended a SNF, SW consult done Ordered Mag Citrate   Mayo, Darla Lesches for Dr. Melina Schools [facility] [phone] 05/04/2016, 7:54 AM

## 2016-05-05 ENCOUNTER — Encounter (HOSPITAL_COMMUNITY): Payer: Self-pay | Admitting: Orthopedic Surgery

## 2016-05-05 LAB — CBC
HCT: 26.7 % — ABNORMAL LOW (ref 39.0–52.0)
Hemoglobin: 8.7 g/dL — ABNORMAL LOW (ref 13.0–17.0)
MCH: 28 pg (ref 26.0–34.0)
MCHC: 32.6 g/dL (ref 30.0–36.0)
MCV: 85.9 fL (ref 78.0–100.0)
PLATELETS: 250 10*3/uL (ref 150–400)
RBC: 3.11 MIL/uL — ABNORMAL LOW (ref 4.22–5.81)
RDW: 13.3 % (ref 11.5–15.5)
WBC: 7.6 10*3/uL (ref 4.0–10.5)

## 2016-05-05 NOTE — Progress Notes (Signed)
Occupational Therapy Treatment Patient Details Name: Alexander Strickland MRN: [id number] DOB: 1946-02-08 Today's Date: 05/05/2016    History of present illness Pt s/p R THR Direct anterior   OT comments  Patient progressing slowly towards OT goals. OT will continue to follow.  Follow Up Recommendations  SNF    Equipment Recommendations  None recommended by OT    Recommendations for Other Services      Precautions / Restrictions Precautions Precautions: Fall Restrictions Weight Bearing Restrictions: No Other Position/Activity Restrictions: WBAT       Mobility Bed Mobility Overal bed mobility: Needs Assistance Bed Mobility: Supine to Sit;Sit to Supine     Supine to sit: Min assist Sit to supine: Min assist   General bed mobility comments: cues for sequence and use of LLE to self assist  Transfers Overall transfer level: Needs assistance Equipment used: Rolling walker (2 wheeled) Transfers: Sit to/from Stand Sit to Stand: Min guard         General transfer comment: cues for hand and right leg position    Balance                                   ADL Overall ADL's : Needs assistance/impaired Eating/Feeding: Independent;Bed level   Grooming: Min guard;Standing   Upper Body Bathing: Set up;Sitting   Lower Body Bathing: Minimal assistance;Sit to/from stand           Toilet Transfer: Min guard;Ambulation;BSC;RW   Toileting- Clothing Manipulation and Hygiene: Minimal assistance;Sit to/from stand       Functional mobility during ADLs: Electronics engineer     Praxis      Cognition   Behavior During Therapy: WFL for tasks assessed/performed Overall Cognitive Status: Within Functional Limits for tasks assessed                       Extremity/Trunk Assessment               Exercises     Shoulder Instructions       General Comments      Pertinent  Vitals/ Pain       Pain Assessment: 0-10 Pain Score: 6  Pain Location: R hip Pain Descriptors / Indicators: Aching;Sore Pain Intervention(s): Limited activity within patient's tolerance;Monitored during session;RN gave pain meds during session  Home Living                                          Prior Functioning/Environment              Frequency Min 2X/week     Progress Toward Goals  OT Goals(current goals can now be found in the care plan section)  Progress towards OT goals: Progressing toward goals  Acute Rehab OT Goals Patient Stated Goal: Rehab and then home  Plan Discharge plan remains appropriate    Co-evaluation                 End of Session Equipment Utilized During Treatment: Rolling walker   Activity Tolerance Patient tolerated treatment well   Patient Left in bed;with call bell/phone within reach   Nurse Communication Mobility  status        Time: [id number] OT Time Calculation (min): 17 min  Charges: OT General Charges $OT Visit: 1 Procedure OT Treatments $Self Care/Home Management : 8-22 mins  Alexander Strickland A 05/05/2016, 11:32 AM

## 2016-05-05 NOTE — Clinical Social Work Placement (Signed)
   CLINICAL SOCIAL WORK PLACEMENT  NOTE  Date:  05/05/2016  Patient Details  Name: Alexander Strickland MRN: [id number] Date of Birth: 1946/07/30  Clinical Social Work is seeking post-discharge placement for this patient at the [place] level of care (*CSW will initial, date and re-position this form in  chart as items are completed):  Yes   Patient/family provided with [facility] Work Department's list of facilities offering this level of care within the geographic area requested by the patient (or if unable, by the patient's family).  Yes   Patient/family informed of their freedom to choose among providers that offer the needed level of care, that participate in Medicare, Medicaid or managed care program needed by the patient, have an available bed and are willing to accept the patient.  Yes   Patient/family informed of [facility]'s ownership interest in [facility] and [facility], as well as of the fact that they are under no obligation to receive care at these facilities.  PASRR submitted to EDS on 05/03/16     PASRR number received on 05/03/16     Existing PASRR number confirmed on       FL2 transmitted to all facilities in geographic area requested by pt/family on 05/03/16     FL2 transmitted to all facilities within larger geographic area on       Patient informed that his/her managed care company has contracts with or will negotiate with certain facilities, including the following:      Yes      Patient/family informed of bed offers received.  05/05/2016   Patient chooses bed at      [facility]  Physician recommends and patient chooses bed at     SNF Patient to be transferred to   on  .  05/05/2016  Patient to be transferred to facility by     EMS  Patient family notified on   of transfer.  Wife  Name of family member notified:        PHYSICIAN       Additional Comment:     _______________________________________________ Lilly Cove, LCSW 05/05/2016, 10:40 AM

## 2016-05-05 NOTE — Progress Notes (Signed)
   Subjective:  Patient reports pain as mild to moderate.  No c/o. Denies N/V/CP/SOB.  Objective:   VITALS:   Filed Vitals:   05/04/16 0509 05/04/16 1447 05/04/16 2200 05/05/16 0550  BP: 150/59 136/77 149/71 136/69  Pulse: 51 56 58 60  Temp: 98.3 F (36.8 C) 97.9 F (36.6 C) 98.4 F (36.9 C) 97.8 F (36.6 C)  TempSrc: Oral Oral Oral Oral  Resp: 16 18 18 18   Height:      Weight:      SpO2: 97% 94% 93% 97%    ABD soft Sensation intact distally Intact pulses distally Dorsiflexion/Plantar flexion intact Incision: dressing C/D/I Compartment soft   Lab Results  Component Value Date   WBC 7.6 05/05/2016   HGB 8.7* 05/05/2016   HCT 26.7* 05/05/2016   MCV 85.9 05/05/2016   PLT 250 05/05/2016   BMET    Component Value Date/Time   NA 137 05/03/2016 0357   K 4.9 05/03/2016 0357   CL 107 05/03/2016 0357   CO2 24 05/03/2016 0357   GLUCOSE 180* 05/03/2016 0357   BUN 25* 05/03/2016 0357   CREATININE 1.23 05/03/2016 0357   CALCIUM 8.6* 05/03/2016 0357   GFRNONAA 57* 05/03/2016 0357   GFRAA >60 05/03/2016 0357     Assessment/Plan: 3 Days Post-Op   Principal Problem:   Primary osteoarthritis of right hip   WBAT with walker ABLA: asymptomatic, monitor DVT ppx: ASA, SCDs, TEDs PT/OT Discharge to SNF when bed available    Keondria Siever, Horald Pollen 05/05/2016, 7:00 AM   Rod Can, MD Cell [phone]

## 2016-05-05 NOTE — Progress Notes (Signed)
Attempted to call report to [facility]. Was transferred but phone kept ringing. No answer.

## 2016-05-05 NOTE — NC FL2 (Signed)
[place] LEVEL OF CARE SCREENING TOOL     IDENTIFICATION  Patient Name: Alexander Strickland Birthdate: 1946/08/09 Sex: male Admission Date (Current Location): 05/02/2016  [facility] and [place] Number:  Herbalist and Address:  [facility],  [place] [address], [place]      Provider Number: [id number]  Attending Physician Name and Address:  Rod Can, MD  Relative Name and Phone Number:       Current Level of Care: Hospital Recommended Level of Care: [place] Prior Approval Number:    Date Approved/Denied:   PASRR Number: [id number]  Discharge Plan: SNF    Current Diagnoses: Patient Active Problem List   Diagnosis Date Noted  . Primary osteoarthritis of right hip 05/02/2016  . Dizziness - light-headed 07/03/2012  . Bradycardia 07/03/2012  . Hypertension 07/03/2012  . Chest pain 07/03/2012  . Anemia 07/03/2012    Orientation RESPIRATION BLADDER Height & Weight     Self, Time, Situation, Place  Normal Continent Weight: 217 lb (98.431 kg) Height:  6' (182.9 cm)  BEHAVIORAL SYMPTOMS/MOOD NEUROLOGICAL BOWEL NUTRITION STATUS      Continent Diet (regualr)  AMBULATORY STATUS COMMUNICATION OF NEEDS Skin   Extensive Assist Verbally Surgical wounds                       Personal Care Assistance Level of Assistance  Bathing, Dressing, Feeding Bathing Assistance: Limited assistance Feeding assistance: Independent Dressing Assistance: Limited assistance     Functional Limitations Info  Sight, Hearing, Speech Sight Info: Adequate Hearing Info: Adequate Speech Info: Adequate    SPECIAL CARE FACTORS FREQUENCY  PT (By licensed PT), OT (By licensed OT)     PT Frequency: 5 OT Frequency: 5            Contractures Contractures Info: Not present    Additional Factors Info  Code Status, Allergies Code Status Info: Full Code Allergies Info: Pollen Extract           Current Medications  (05/05/2016):  This is the current hospital active medication list Current Facility-Administered Medications  Medication Dose Route Frequency Provider Last Rate Last Dose  . acetaminophen (TYLENOL) tablet 650 mg  650 mg Oral Q6H PRN Rod Can, MD   650 mg at 05/04/16 2008   Or  . acetaminophen (TYLENOL) suppository 650 mg  650 mg Rectal Q6H PRN Rod Can, MD      . amLODipine (NORVASC) tablet 5 mg  5 mg Oral Daily Rod Can, MD   5 mg at 05/05/16 0902  . aspirin EC tablet 81 mg  81 mg Oral BID PC Rod Can, MD   81 mg at 05/04/16 1731  . cholecalciferol (VITAMIN D) tablet 1,000 Units  1,000 Units Oral Daily Rod Can, MD   1,000 Units at 05/05/16 0902  . diphenhydrAMINE (BENADRYL) 12.5 MG/5ML elixir 12.5-25 mg  12.5-25 mg Oral Q4H PRN Rod Can, MD      . docusate sodium (COLACE) capsule 100 mg  100 mg Oral BID Rod Can, MD   100 mg at 05/05/16 0902  . HYDROmorphone (DILAUDID) injection 0.5-2 mg  0.5-2 mg Intravenous Q2H PRN Rod Can, MD      . magnesium citrate solution 1 Bottle  1 Bottle Oral Once PRN Rod Can, MD      . menthol-cetylpyridinium (CEPACOL) lozenge 3 mg  1 lozenge Oral PRN Rod Can, MD       Or  . phenol (CHLORASEPTIC) mouth  spray 1 spray  1 spray Mouth/Throat PRN Rod Can, MD      . methocarbamol (ROBAXIN) tablet 500 mg  500 mg Oral Q6H PRN Rod Can, MD   500 mg at 05/05/16 0206   Or  . methocarbamol (ROBAXIN) 500 mg in dextrose 5 % 50 mL IVPB  500 mg Intravenous Q6H PRN Rod Can, MD   500 mg at 05/02/16 1047  . metoCLOPramide (REGLAN) tablet 5-10 mg  5-10 mg Oral Q8H PRN Rod Can, MD       Or  . metoCLOPramide (REGLAN) injection 5-10 mg  5-10 mg Intravenous Q8H PRN Rod Can, MD      . naphazoline-glycerin (CLEAR EYES) ophth solution 1-2 drop  1-2 drop Both Eyes QID PRN Rod Can, MD      . omeprazole (PRILOSEC OTC) EC tablet 20 mg  20 mg Oral Daily Rod Can, MD   20 mg at 05/04/16  1043  . ondansetron (ZOFRAN) tablet 4 mg  4 mg Oral Q6H PRN Rod Can, MD   4 mg at 05/04/16 [id number]   Or  . ondansetron [facility]) injection 4 mg  4 mg Intravenous Q6H PRN Rod Can, MD      . oxyCODONE (Oxy IR/ROXICODONE) immediate release tablet 5-10 mg  5-10 mg Oral Q3H PRN Rod Can, MD   10 mg at 05/05/16 0902  . senna (SENOKOT) tablet 17.2 mg  2 tablet Oral QHS Rod Can, MD   17.2 mg at 05/03/16 2228  . sorbitol 70 % solution 30 mL  30 mL Oral Daily PRN Rod Can, MD         Discharge Medications: Please see discharge summary for a list of discharge medications.  Relevant Imaging Results:  Relevant Lab Results:   Additional Information SSN  [id number]    Lilly [place], [place]

## 2016-05-05 NOTE — Progress Notes (Signed)
Physical Therapy Treatment Patient Details Name: Alexander Strickland MRN: [id number] DOB: 07-26-1946 Today's Date: 05/05/2016    History of Present Illness Pt s/p R THR Direct anterior    PT Comments    POD # 3 Assisted OOB to amb a greater distance.  Min c/o dizziness at end of walk but "nothing like it was".  Assisted back to bed and performed THR TE's followed by ICE.    Follow Up Recommendations  SNF     Equipment Recommendations       Recommendations for Other Services       Precautions / Restrictions Precautions Precautions: Fall Restrictions Weight Bearing Restrictions: No Other Position/Activity Restrictions: WBAT    Mobility  Bed Mobility Overal bed mobility: Needs Assistance Bed Mobility: Supine to Sit;Sit to Supine     Supine to sit: Supervision Sit to supine: Supervision   General bed mobility comments: pt crossesd B LE to assist r LE off/on bed  Transfers Overall transfer level: Needs assistance Equipment used: Rolling walker (2 wheeled) Transfers: Sit to/from Stand Sit to Stand: Supervision         General transfer comment: <25% VC's on safety with turns  Ambulation/Gait Ambulation/Gait assistance: Min guard Ambulation Distance (Feet): 75 Feet Assistive device: Rolling walker (2 wheeled) Gait Pattern/deviations: Step-to pattern;Step-through pattern Gait velocity: decreased   General Gait Details: <25% VC's on proper walker to self dustance and safety with turns.  Tolerated increased distance with min c/o dizziness.    Stairs            Wheelchair Mobility    Modified Rankin (Stroke Patients Only)       Balance                                    Cognition Arousal/Alertness: Awake/alert Behavior During Therapy: WFL for tasks assessed/performed Overall Cognitive Status: Within Functional Limits for tasks assessed                      Exercises      General Comments        Pertinent Vitals/Pain  Pain Assessment: 0-10 Pain Score: 6  Pain Location: R hip Pain Descriptors / Indicators: Aching;Sore;Tightness Pain Intervention(s): Monitored during session;Premedicated before session;Repositioned;Ice applied    Home Living                      Prior Function            PT Goals (current goals can now be found in the care plan section) Acute Rehab PT Goals Patient Stated Goal: Rehab and then home Progress towards PT goals: Progressing toward goals    Frequency  7X/week    PT Plan Current plan remains appropriate    Co-evaluation             End of Session Equipment Utilized During Treatment: Gait belt Activity Tolerance: Patient tolerated treatment well Patient left: in bed;with call bell/phone within reach     Time: 0938-1005 PT Time Calculation (min) (ACUTE ONLY): 27 min  Charges:  $Gait Training: 8-22 mins $Therapeutic Exercise: 8-22 mins                    G Codes:      Rica Koyanagi  PTA [facility]  Acute  Rehab Pager      [phone]

## 2016-05-05 NOTE — Progress Notes (Addendum)
LCSW follow patient for disposition.. Patient's primary insurance: medicare part A and B. Family is wanting placement at SNF at [facility] [place]. LCSW has verified with [facility] regarding insurance in which they have verified.  [facility] number given to facility. Updating FL2 for clinicals and sending information to facility. LCSW spoke with wife via phone: Maryanna Shape. She is agreeable to plan and reports facility is in her neighborhood so it will be close.   Awaiting MD response to see if patient is medically stable to DC today as bed is available.  DC today. EMS transport at 12:00.  Lane Hacker, MSW Clinical Social Work: [employer] [phone]

## 2018-01-17 ENCOUNTER — Encounter (HOSPITAL_COMMUNITY): Payer: Self-pay | Admitting: Emergency Medicine

## 2018-01-17 ENCOUNTER — Emergency Department (HOSPITAL_COMMUNITY)
Admission: EM | Admit: 2018-01-17 | Discharge: 2018-01-17 | Disposition: A | Discharge status: Home / Self Care | Payer: Medicare Other | Attending: Emergency Medicine | Admitting: Emergency Medicine

## 2018-01-17 ENCOUNTER — Emergency Department (HOSPITAL_COMMUNITY): Payer: Medicare Other

## 2018-01-17 DIAGNOSIS — Z79899 Other long term (current) drug therapy: Secondary | ICD-10-CM | POA: Insufficient documentation

## 2018-01-17 DIAGNOSIS — Z7982 Long term (current) use of aspirin: Secondary | ICD-10-CM | POA: Insufficient documentation

## 2018-01-17 DIAGNOSIS — Z87891 Personal history of nicotine dependence: Secondary | ICD-10-CM | POA: Insufficient documentation

## 2018-01-17 DIAGNOSIS — I10 Essential (primary) hypertension: Secondary | ICD-10-CM | POA: Diagnosis not present

## 2018-01-17 DIAGNOSIS — Z96641 Presence of right artificial hip joint: Secondary | ICD-10-CM | POA: Diagnosis not present

## 2018-01-17 DIAGNOSIS — Y999 Unspecified external cause status: Secondary | ICD-10-CM | POA: Diagnosis not present

## 2018-01-17 DIAGNOSIS — Y929 Unspecified place or not applicable: Secondary | ICD-10-CM | POA: Diagnosis not present

## 2018-01-17 DIAGNOSIS — Y9355 Activity, bike riding: Secondary | ICD-10-CM | POA: Insufficient documentation

## 2018-01-17 DIAGNOSIS — S0990XA Unspecified injury of head, initial encounter: Secondary | ICD-10-CM | POA: Diagnosis not present

## 2018-01-17 LAB — I-STAT CHEM 8, ED
BUN: 21 mg/dL — AB (ref 6–20)
CALCIUM ION: 1.21 mmol/L (ref 1.15–1.40)
CREATININE: 1 mg/dL (ref 0.61–1.24)
Chloride: 103 mmol/L (ref 101–111)
Glucose, Bld: 136 mg/dL — ABNORMAL HIGH (ref 65–99)
HEMATOCRIT: 44 % (ref 39.0–52.0)
Hemoglobin: 15 g/dL (ref 13.0–17.0)
Potassium: 5.3 mmol/L — ABNORMAL HIGH (ref 3.5–5.1)
Sodium: 139 mmol/L (ref 135–145)
TCO2: 26 mmol/L (ref 22–32)

## 2018-01-17 MED ORDER — IBUPROFEN 800 MG PO TABS: 800.0000 mg | ORAL_TABLET | Freq: Three times a day (TID) | ORAL | 0 refills | Status: DC | PRN

## 2018-01-17 NOTE — Discharge Instructions (Signed)
Follow-up with family doctor if not improving.  Take Tylenol or the prescribed Motrin for discomfort

## 2018-01-17 NOTE — ED Triage Notes (Signed)
Patient here from home with complaints of fall/head injury that occurred last night. Nausea, no vomiting. Ambulatory. No confusion.
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
## 2019-02-10 ENCOUNTER — Telehealth: Payer: Self-pay | Admitting: Genetic Counselor

## 2019-02-10 ENCOUNTER — Encounter: Payer: Self-pay | Admitting: Genetic Counselor

## 2019-02-10 NOTE — Telephone Encounter (Signed)
A genetic counseling appt has been scheduled for the pt to see Leda Quail on 3/12 at 1015am. Letter mailed to the pt.

## 2019-03-03 ENCOUNTER — Inpatient Hospital Stay: Payer: Medicare Other | Attending: Oncology | Admitting: Genetic Counselor

## 2019-03-03 ENCOUNTER — Inpatient Hospital Stay: Payer: Medicare Other

## 2019-03-03 ENCOUNTER — Other Ambulatory Visit: Payer: Self-pay

## 2019-03-03 ENCOUNTER — Encounter: Payer: Self-pay | Admitting: Genetic Counselor

## 2019-03-03 DIAGNOSIS — Z803 Family history of malignant neoplasm of breast: Secondary | ICD-10-CM

## 2019-03-03 DIAGNOSIS — Z8042 Family history of malignant neoplasm of prostate: Secondary | ICD-10-CM

## 2019-03-03 DIAGNOSIS — Z809 Family history of malignant neoplasm, unspecified: Secondary | ICD-10-CM | POA: Diagnosis not present

## 2019-03-03 DIAGNOSIS — C259 Malignant neoplasm of pancreas, unspecified: Secondary | ICD-10-CM

## 2019-03-03 DIAGNOSIS — Z315 Encounter for genetic counseling: Secondary | ICD-10-CM

## 2019-03-03 NOTE — Progress Notes (Signed)
[facility]      Initial Visit   Patient Name: Jesusmanuel Erbes Patient DOB: 1946/07/30 Patient Age: 73 y.o. Encounter Date: 03/03/2019  Referring Provider: Vonna Drafts, MD  Reason for Visit: Evaluate for hereditary susceptibility to cancer    Assessment and Plan:  . Mr. Portman personal history of pancreatic cancer warrants a genetics evaluation even though his family history is not suggestive of a hereditary predisposition to cancer. His father had no sisters. This paucity of women makes risk assessment challenging.   . Testing is recommended to determine whether he has a pathogenic mutation that may impact his future treatment options as well as be important for family members.   . Mr. Iten wished to pursue genetic testing and a blood sample will be sent for analysis of the 91 genes on [facility]'s Multi-Cancer panel combined with preliminary evidence pancreatic susceptibility genes and chronic pancreatitis susceptibility genes: AIP, ALK, APC, ATM, AXIN2, BAP1, BARD1, BLM, BMPR1A, BRCA1, BRCA2, BRIP1, CASR, CDC73, CDH1, CDK4, CDKN1B, CDKN1C, CDKN2A, CEBPA, CFTR, CHEK2, CPA1, CTNNA1, CTRC, DICER1, DIS3L2, EGFR, EPCAM, FANCC, FH, FLCN, GATA2, GPC3, GREM1, HOXB13,  HRAS, KIT, MAX, MEN1, MET, MITF, MLH1, MSH2, MSH3, MSH6, MUTYH, NBN, NF1, NF2, NTHL1, PALB2, PALLD, PDGFRA, PHOX2B, PMS2, POLD1, POLE, POT1, PRKAR1A, PRSS1, PTCH1, PTEN, RAD50, RAD51C, RAD51D, RB1, RECQL4, RET, RUNX1, SDHA, SDHAF2, SDHB, SDHC, SDHD, SMAD4, SMARCA4, SMARCB1, SMARCE1, SPINK1, STK11, SUFU, TERC, TERT, TMEM127, TP53, TSC1, TSC2, VHL, WRN, and WT1.  . Mr. Evetts  chose to participate in [facility]'s Detect Pancreatic Cancer study. This is the same clinical-grade testing, but is sponsored by partners of the lab. There is no cost to him or to his insurance company. Mr. Parekh was informed that third parties and commercial organizations may receive de-identified patient data, but at no time would  they receive his identifiable information.    . Results should be available in approximately 2-3 weeks, at which point I will contact him and address implications for him as well as address genetic testing for at-risk family members, if needed.      Mr. Harland questions were answered to his satisfaction today and he is welcome to call with any additional questions or concerns. Thank you for the referral and allowing me to share in the care of your patient.    Dr. Jana Hakim was available for questions concerning this case. Total time spent by me in face-to-face counseling was approximately 25 minutes.   _____________________________________________________________________   History of Present Illness: Mr. Kypton Eltringham, a 73 y.o. male, is being seen at the [facility] due to a personal and family history of cancer. He presents to clinic today to discuss the possibility of a hereditary predisposition to cancer and discuss whether genetic testing is warranted.  Mr. Mcnay was diagnosed with metastatic pancreatic cancer at the age of 18. He is currently receiving chemotherapy.    Past Medical History:  Diagnosis Date  . Anemia   . Arthritis   . Family history of cancer   . Family history of cancer   . GERD (gastroesophageal reflux disease)   . Hypertension     Past Surgical History:  Procedure Laterality Date  . JOINT REPLACEMENT     hip  . KNEE SURGERY     Bilateral TKRs  . SHOULDER SURGERY     Left Rotator Cuff Repair  . TOTAL HIP ARTHROPLASTY Right 05/02/2016   Procedure: RIGHT TOTAL HIP ARTHROPLASTY ANTERIOR APPROACH;  Surgeon:  Rod Can, MD;  Location: [facility];  Service: Orthopedics;  Laterality: Right;    Social History   Socioeconomic History  . Marital status: Married    Spouse name: Not on file  . Number of children: Not on file  . Years of education: Not on file  . Highest education level: Not on file  Occupational History  . Not on  file  Social Needs  . Financial resource strain: Not on file  . Food insecurity:    Worry: Not on file    Inability: Not on file  . Transportation needs:    Medical: Not on file    Non-medical: Not on file  Tobacco Use  . Smoking status: Former Smoker    Last attempt to quit: 01/19/2000    Years since quitting: 19.1  . Smokeless tobacco: Never Used  Substance and Sexual Activity  . Alcohol use: No    Comment: History of ETOH abuse, no drinks x3 years  . Drug use: No  . Sexual activity: Not on file  Lifestyle  . Physical activity:    Days per week: Not on file    Minutes per session: Not on file  . Stress: Not on file  Relationships  . Social connections:    Talks on phone: Not on file    Gets together: Not on file    Attends religious service: Not on file    Active member of club or organization: Not on file    Attends meetings of clubs or organizations: Not on file    Relationship status: Not on file  Other Topics Concern  . Not on file  Social History Narrative  . Not on file     Family History:  During the visit, a 4-generation pedigree was obtained. Family tree will be scanned in the Media tab in Epic  Significant diagnoses include the following:  Family History  Problem Relation Age of Onset  . Coronary artery disease Father   . Diabetes type II Father   . Coronary artery disease Brother   . Coronary artery disease Sister        X 2  . Diabetes type II Brother   . Prostate cancer Brother 20       currently 41  . Heart failure Mother   . Cancer Mother        in abdominal area  . Breast cancer Mother        deceased 67  . Cancer Maternal Aunt        unk. primary; deceased 56s  . Cancer Maternal Uncle        unk. primary; deceased  . Cancer Cousin        maternal cousins with cancers; unspecified    Additionally, Mr. Burnham has 2 daughter (ages 68s-50s). He has 2 brothers and 4 sisters. His mother had 5 brothers and 5 sisters. His father died at 8.  He had no siblings.  Mr. Walston ancestry is African American. There is no known Jewish ancestry and no consanguinity.  Discussion: We reviewed the characteristics, features and inheritance patterns of hereditary cancer syndromes. We discussed his risk of harboring a mutation in the context of his personal and family history. We discussed that his small paternal family and paucity of women make risk assessment challenging. We discussed the process of genetic testing, insurance coverage and implications of results: positive, negative and variant of uncertain significance (VUS).     Steele Berg, MS, [facility] Certified Genetic Counselor  phone: [phone] [email or web address]

## 2019-03-22 ENCOUNTER — Ambulatory Visit: Payer: Self-pay | Admitting: Genetic Counselor

## 2019-03-22 ENCOUNTER — Encounter: Payer: Self-pay | Admitting: Genetic Counselor

## 2019-03-22 DIAGNOSIS — Z1379 Encounter for other screening for genetic and chromosomal anomalies: Secondary | ICD-10-CM

## 2019-03-22 HISTORY — DX: Encounter for other screening for genetic and chromosomal anomalies: Z13.79

## 2019-03-22 NOTE — Progress Notes (Signed)
[facility]         Results Disclosure    Patient Name: Alexander Strickland Patient DOB: [birth year] Patient Age: 73 y.o. Encounter Date: 03/22/2019  Referring Provider: Vonna Drafts, MD   Mr. Veltre was seen in the [facility] on 03/03/2019 due to a personal and family history of cancer and concern regarding a hereditary predisposition to cancer in the family. Please refer to the prior Genetics clinic note for more information regarding Mr. Lundeen medical and family histories and our assessment at the time.   Genetic Testing: At the time of Mr. Lemoine visit, he chose to pursue genetic testing of multiple genes associated with hereditary susceptibility to cancer. Testing included sequencing and deletion/duplication analysis. 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 A copy of the genetic test report will be scanned into Epic under the Media tab.  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 The specific mutation identified in Mr. Ragone is also associated with an increased risk of chronic pancreatitis.  Genes Analysed: The genes on the panel were the 91 genes on [facility]'s Multi-Cancer panel combined with preliminary evidence pancreatic susceptibility genes and chronic pancreatitis susceptibility genes: AIP, ALK, APC, ATM, AXIN2, BAP1, BARD1, BLM, BMPR1A, BRCA1, BRCA2, BRIP1, CASR, CDC73, CDH1, CDK4, CDKN1B, CDKN1C, CDKN2A, CEBPA, CFTR, CHEK2, CPA1, CTNNA1, CTRC, DICER1, DIS3L2, EGFR, EPCAM, FANCC, FH, FLCN, GATA2, GPC3, GREM1, HOXB13,  HRAS, KIT, MAX, MEN1, MET, MITF, MLH1, MSH2, MSH3, MSH6, MUTYH, NBN, NF1, NF2, NTHL1, PALB2, PALLD, PDGFRA, PHOX2B, PMS2, POLD1, POLE, POT1, PRKAR1A, PRSS1, PTCH1, PTEN, RAD50, RAD51C, RAD51D, RB1, RECQL4, RET, RUNX1, SDHA, SDHAF2, SDHB, SDHC, SDHD, SMAD4, SMARCA4, SMARCB1, SMARCE1, SPINK1, STK11,  SUFU, TERC, TERT, TMEM127, TP53, TSC1, TSC2, VHL, WRN, and WT1.  CFTR Gene: The CFTRgene is associated with autosomal recessive cystic fibrosis. Having a single CFTR mutation does not increase cancer risk, but this mutation can increase risk of chronic pancreatitis. Pathogenic variants in CFTR may confer an approximately 4-10 fold increased risk for chronic pancreatitis in heterozygous carriers. Hereditary pancreatitis is characterized by recurrent episodes of acute inflammation of the pancreas (pancreatitis) beginning in childhood or adolescence leading to chronic pancreatitis. Chronic pancreatitis is a risk factor for pancreatic cancer.  The TG[11]T[5] allele is reported to cause congenital absence of the vas deferens (CAVD) in males when homozygous or present on the opposite chromosome (in trans) from a second pathogenic CFTR mutation. This allele is not expected to cause cystic fibrosis in either males or females, although in rare cases individuals may have borderline sweat chloride results and/or mild respiratory disease.  Cancer Screening: We emphasized that being a carrier of a single CFTR mutation does not increase cancer risk. Mr. Ramcharan is recommended to follow the recommendations of his physicians.  Family Members: It is important that Mr. Dalby informs his relatives of these results so they can decide whether to pursue genetic testing. Mr. Corsino siblings and children have a 50% chance to have inherited this mutation. They are recommended to speak to a genetic counselor as this could have implications on reproduction.  We encouraged Mr. Gotay to remain in contact with [place] on an annual basis so we can update his personal and family histories, and let him know of advances in cancer genetics that may benefit the family. Our contact number was provided. Mr. Rabideau questions were answered to his satisfaction  today, and he knows he is welcome to call anytime with additional questions.    Steele Berg, MS, [facility] Certified Genetic Counselor phone: [phone] [email or web address]

## 2019-04-27 ENCOUNTER — Telehealth: Payer: Self-pay | Admitting: *Deleted

## 2019-04-27 NOTE — Telephone Encounter (Signed)
Medical records faxed to [facility]. [facility]) [facility]; [place] [phone]

## 2019-09-03 ENCOUNTER — Inpatient Hospital Stay (HOSPITAL_COMMUNITY)

## 2019-09-03 ENCOUNTER — Emergency Department (HOSPITAL_COMMUNITY)

## 2019-09-03 ENCOUNTER — Inpatient Hospital Stay (HOSPITAL_COMMUNITY)
Admission: EM | Admit: 2019-09-03 | Discharge: 2019-09-07 | DRG: 064 | Disposition: A | Discharge status: Hospice | Attending: Internal Medicine | Admitting: Internal Medicine

## 2019-09-03 ENCOUNTER — Other Ambulatory Visit: Payer: Self-pay

## 2019-09-03 ENCOUNTER — Encounter (HOSPITAL_COMMUNITY): Payer: Self-pay | Admitting: Radiology

## 2019-09-03 DIAGNOSIS — Z515 Encounter for palliative care: Secondary | ICD-10-CM | POA: Diagnosis not present

## 2019-09-03 DIAGNOSIS — F419 Anxiety disorder, unspecified: Secondary | ICD-10-CM | POA: Diagnosis present

## 2019-09-03 DIAGNOSIS — I6389 Other cerebral infarction: Secondary | ICD-10-CM | POA: Diagnosis not present

## 2019-09-03 DIAGNOSIS — E871 Hypo-osmolality and hyponatremia: Secondary | ICD-10-CM | POA: Diagnosis present

## 2019-09-03 DIAGNOSIS — D630 Anemia in neoplastic disease: Secondary | ICD-10-CM | POA: Diagnosis present

## 2019-09-03 DIAGNOSIS — Z96653 Presence of artificial knee joint, bilateral: Secondary | ICD-10-CM | POA: Diagnosis present

## 2019-09-03 DIAGNOSIS — C259 Malignant neoplasm of pancreas, unspecified: Secondary | ICD-10-CM | POA: Diagnosis present

## 2019-09-03 DIAGNOSIS — K219 Gastro-esophageal reflux disease without esophagitis: Secondary | ICD-10-CM | POA: Diagnosis present

## 2019-09-03 DIAGNOSIS — Z96641 Presence of right artificial hip joint: Secondary | ICD-10-CM | POA: Diagnosis present

## 2019-09-03 DIAGNOSIS — Z8249 Family history of ischemic heart disease and other diseases of the circulatory system: Secondary | ICD-10-CM

## 2019-09-03 DIAGNOSIS — C799 Secondary malignant neoplasm of unspecified site: Secondary | ICD-10-CM | POA: Diagnosis present

## 2019-09-03 DIAGNOSIS — Z803 Family history of malignant neoplasm of breast: Secondary | ICD-10-CM | POA: Diagnosis not present

## 2019-09-03 DIAGNOSIS — G8929 Other chronic pain: Secondary | ICD-10-CM | POA: Diagnosis present

## 2019-09-03 DIAGNOSIS — I10 Essential (primary) hypertension: Secondary | ICD-10-CM | POA: Diagnosis present

## 2019-09-03 DIAGNOSIS — R29713 NIHSS score 13: Secondary | ICD-10-CM | POA: Diagnosis present

## 2019-09-03 DIAGNOSIS — Z79891 Long term (current) use of opiate analgesic: Secondary | ICD-10-CM | POA: Diagnosis not present

## 2019-09-03 DIAGNOSIS — E11649 Type 2 diabetes mellitus with hypoglycemia without coma: Secondary | ICD-10-CM | POA: Diagnosis present

## 2019-09-03 DIAGNOSIS — Z833 Family history of diabetes mellitus: Secondary | ICD-10-CM | POA: Diagnosis not present

## 2019-09-03 DIAGNOSIS — Z87891 Personal history of nicotine dependence: Secondary | ICD-10-CM

## 2019-09-03 DIAGNOSIS — Z791 Long term (current) use of non-steroidal anti-inflammatories (NSAID): Secondary | ICD-10-CM | POA: Diagnosis not present

## 2019-09-03 DIAGNOSIS — G9341 Metabolic encephalopathy: Secondary | ICD-10-CM | POA: Diagnosis present

## 2019-09-03 DIAGNOSIS — R569 Unspecified convulsions: Secondary | ICD-10-CM | POA: Diagnosis present

## 2019-09-03 DIAGNOSIS — Z7982 Long term (current) use of aspirin: Secondary | ICD-10-CM

## 2019-09-03 DIAGNOSIS — R4182 Altered mental status, unspecified: Secondary | ICD-10-CM | POA: Diagnosis not present

## 2019-09-03 DIAGNOSIS — I6340 Cerebral infarction due to embolism of unspecified cerebral artery: Secondary | ICD-10-CM | POA: Diagnosis present

## 2019-09-03 DIAGNOSIS — D6959 Other secondary thrombocytopenia: Secondary | ICD-10-CM | POA: Diagnosis present

## 2019-09-03 DIAGNOSIS — R400 Somnolence: Secondary | ICD-10-CM | POA: Diagnosis not present

## 2019-09-03 DIAGNOSIS — E860 Dehydration: Secondary | ICD-10-CM | POA: Diagnosis present

## 2019-09-03 DIAGNOSIS — D72829 Elevated white blood cell count, unspecified: Secondary | ICD-10-CM | POA: Diagnosis present

## 2019-09-03 DIAGNOSIS — G8194 Hemiplegia, unspecified affecting left nondominant side: Secondary | ICD-10-CM | POA: Diagnosis present

## 2019-09-03 DIAGNOSIS — Z79899 Other long term (current) drug therapy: Secondary | ICD-10-CM

## 2019-09-03 DIAGNOSIS — Z20828 Contact with and (suspected) exposure to other viral communicable diseases: Secondary | ICD-10-CM | POA: Diagnosis present

## 2019-09-03 DIAGNOSIS — E162 Hypoglycemia, unspecified: Secondary | ICD-10-CM | POA: Diagnosis present

## 2019-09-03 DIAGNOSIS — R471 Dysarthria and anarthria: Secondary | ICD-10-CM | POA: Diagnosis present

## 2019-09-03 LAB — DIFFERENTIAL
Abs Immature Granulocytes: 0.13 10*3/uL — ABNORMAL HIGH (ref 0.00–0.07)
Basophils Absolute: 0 10*3/uL (ref 0.0–0.1)
Basophils Relative: 0 %
Eosinophils Absolute: 0 10*3/uL (ref 0.0–0.5)
Eosinophils Relative: 0 %
Immature Granulocytes: 1 %
Lymphocytes Relative: 6 %
Lymphs Abs: 1.1 10*3/uL (ref 0.7–4.0)
Monocytes Absolute: 1.6 10*3/uL — ABNORMAL HIGH (ref 0.1–1.0)
Monocytes Relative: 9 %
Neutro Abs: 15.1 10*3/uL — ABNORMAL HIGH (ref 1.7–7.7)
Neutrophils Relative %: 84 %

## 2019-09-03 LAB — CBC
HCT: 22.7 % — ABNORMAL LOW (ref 39.0–52.0)
Hemoglobin: 7.3 g/dL — ABNORMAL LOW (ref 13.0–17.0)
MCH: 27.9 pg (ref 26.0–34.0)
MCHC: 32.2 g/dL (ref 30.0–36.0)
MCV: 86.6 fL (ref 80.0–100.0)
Platelets: 105 10*3/uL — ABNORMAL LOW (ref 150–400)
RBC: 2.62 MIL/uL — ABNORMAL LOW (ref 4.22–5.81)
RDW: 19.6 % — ABNORMAL HIGH (ref 11.5–15.5)
WBC: 18 10*3/uL — ABNORMAL HIGH (ref 4.0–10.5)
nRBC: 0 % (ref 0.0–0.2)

## 2019-09-03 LAB — CBG MONITORING, ED
Glucose-Capillary: 73 mg/dL (ref 70–99)
Glucose-Capillary: 50 mg/dL — ABNORMAL LOW (ref 70–99)
Glucose-Capillary: 77 mg/dL (ref 70–99)
Glucose-Capillary: 71 mg/dL (ref 70–99)
Glucose-Capillary: 76 mg/dL (ref 70–99)
Glucose-Capillary: 88 mg/dL (ref 70–99)
Glucose-Capillary: 90 mg/dL (ref 70–99)
Glucose-Capillary: 80 mg/dL (ref 70–99)
Glucose-Capillary: 96 mg/dL (ref 70–99)
Glucose-Capillary: 94 mg/dL (ref 70–99)
Glucose-Capillary: 94 mg/dL (ref 70–99)

## 2019-09-03 LAB — COMPREHENSIVE METABOLIC PANEL
ALT: 48 U/L — ABNORMAL HIGH (ref 0–44)
AST: 87 U/L — ABNORMAL HIGH (ref 15–41)
Albumin: 1.9 g/dL — ABNORMAL LOW (ref 3.5–5.0)
Alkaline Phosphatase: 440 U/L — ABNORMAL HIGH (ref 38–126)
Anion gap: 7 (ref 5–15)
BUN: 21 mg/dL (ref 8–23)
CO2: 20 mmol/L — ABNORMAL LOW (ref 22–32)
Calcium: 8.1 mg/dL — ABNORMAL LOW (ref 8.9–10.3)
Chloride: 106 mmol/L (ref 98–111)
Creatinine, Ser: 1.08 mg/dL (ref 0.61–1.24)
GFR calc Af Amer: >60 mL/min (ref >60)
GFR calc non Af Amer: >60 mL/min (ref >60)
Glucose, Bld: 97 mg/dL (ref 70–99)
Potassium: 4.5 mmol/L (ref 3.5–5.1)
Sodium: 133 mmol/L — ABNORMAL LOW (ref 135–145)
Total Bilirubin: 2.1 mg/dL — ABNORMAL HIGH (ref 0.3–1.2)
Total Protein: 5.8 g/dL — ABNORMAL LOW (ref 6.5–8.1)

## 2019-09-03 LAB — GLUCOSE, CAPILLARY: Glucose-Capillary: 103 mg/dL — ABNORMAL HIGH (ref 70–99)

## 2019-09-03 LAB — SARS CORONAVIRUS 2 BY RT PCR (HOSPITAL ORDER, PERFORMED IN ~~LOC~~ HOSPITAL LAB): SARS Coronavirus 2: NEGATIVE

## 2019-09-03 LAB — RAPID URINE DRUG SCREEN, HOSP PERFORMED
Amphetamines: NOT DETECTED
Barbiturates: NOT DETECTED
Benzodiazepines: POSITIVE — AB
Cocaine: NOT DETECTED
Opiates: POSITIVE — AB
Tetrahydrocannabinol: NOT DETECTED

## 2019-09-03 LAB — PROTIME-INR
INR: 1.7 — ABNORMAL HIGH (ref 0.8–1.2)
Prothrombin Time: 19.4 seconds — ABNORMAL HIGH (ref 11.4–15.2)

## 2019-09-03 LAB — APTT: aPTT: 36 seconds (ref 24–36)

## 2019-09-03 MED ORDER — ENOXAPARIN SODIUM 40 MG/0.4ML ~~LOC~~ SOLN
40.0000 mg | SUBCUTANEOUS | Status: DC
  Administered 2019-09-03 – 2019-09-06 (×4): 40 mg via SUBCUTANEOUS
  Filled 2019-09-03 (×5): qty 0.4

## 2019-09-03 MED ORDER — DEXTROSE 10 % IV SOLN
INTRAVENOUS | Status: DC
  Administered 2019-09-03: 75 mL/h via INTRAVENOUS
  Administered 2019-09-04: 01:00:00 via INTRAVENOUS

## 2019-09-03 MED ORDER — ASPIRIN EC 81 MG PO TBEC
81.0000 mg | DELAYED_RELEASE_TABLET | Freq: Two times a day (BID) | ORAL | Status: DC
  Administered 2019-09-04: 10:00:00 81 mg via ORAL
  Filled 2019-09-03: qty 1

## 2019-09-03 MED ORDER — IOHEXOL 350 MG/ML SOLN
100.0000 mL | Freq: Once | INTRAVENOUS | Status: AC | PRN
  Administered 2019-09-03: 100 mL via INTRAVENOUS

## 2019-09-03 MED ORDER — SODIUM CHLORIDE 0.9% FLUSH: 3.0000 mL | Freq: Once | INTRAVENOUS | Status: DC

## 2019-09-03 MED ORDER — OXYCODONE HCL 5 MG PO TABS
5.0000 mg | ORAL_TABLET | ORAL | Status: DC | PRN
  Administered 2019-09-04 – 2019-09-06 (×4): 5 mg via ORAL
  Filled 2019-09-03 (×4): qty 1

## 2019-09-03 MED ORDER — OXYCODONE-ACETAMINOPHEN 5-325 MG PO TABS
1.0000 | ORAL_TABLET | ORAL | Status: DC | PRN
  Administered 2019-09-05: 2 via ORAL
  Filled 2019-09-03: qty 2

## 2019-09-03 MED ORDER — HYDROMORPHONE HCL 1 MG/ML IJ SOLN
1.0000 mg | INTRAMUSCULAR | Status: DC | PRN
  Administered 2019-09-03: 1 mg via INTRAVENOUS
  Filled 2019-09-03: qty 1

## 2019-09-03 MED ORDER — DEXTROSE 50 % IV SOLN
25.0000 mL | Freq: Once | INTRAVENOUS | Status: AC
  Administered 2019-09-03: 11:00:00 25 mL via INTRAVENOUS
  Filled 2019-09-03: qty 50

## 2019-09-03 MED ORDER — PANTOPRAZOLE SODIUM 40 MG PO TBEC
40.0000 mg | DELAYED_RELEASE_TABLET | Freq: Every day | ORAL | Status: DC
  Administered 2019-09-04 – 2019-09-07 (×4): 40 mg via ORAL
  Filled 2019-09-03 (×4): qty 1

## 2019-09-03 MED ORDER — NAPHAZOLINE-PHENIRAMINE 0.025-0.3 % OP SOLN: 1.0000 [drp] | Freq: Four times a day (QID) | OPHTHALMIC | Status: DC | PRN

## 2019-09-03 MED ORDER — OMEPRAZOLE MAGNESIUM 20 MG PO TBEC: 20.0000 mg | DELAYED_RELEASE_TABLET | Freq: Every day | ORAL | Status: DC

## 2019-09-03 MED ORDER — DOCUSATE SODIUM 100 MG PO CAPS
100.0000 mg | ORAL_CAPSULE | Freq: Two times a day (BID) | ORAL | Status: DC
  Administered 2019-09-04 – 2019-09-07 (×7): 100 mg via ORAL
  Filled 2019-09-03 (×8): qty 1

## 2019-09-03 MED ORDER — ONDANSETRON HCL 4 MG PO TABS: 4.0000 mg | ORAL_TABLET | Freq: Three times a day (TID) | ORAL | Status: DC | PRN

## 2019-09-03 MED ORDER — VITAMIN D 25 MCG (1000 UNIT) PO TABS
1000.0000 [IU] | ORAL_TABLET | Freq: Every day | ORAL | Status: DC
  Administered 2019-09-04 – 2019-09-07 (×4): 1000 [IU] via ORAL
  Filled 2019-09-03 (×4): qty 1

## 2019-09-03 MED ORDER — ENOXAPARIN SODIUM 40 MG/0.4ML ~~LOC~~ SOLN: 40.0000 mg | SUBCUTANEOUS | Status: DC

## 2019-09-03 MED ORDER — SODIUM CHLORIDE 0.9 % IV BOLUS
1000.0000 mL | Freq: Once | INTRAVENOUS | Status: AC
  Administered 2019-09-03: 12:00:00 1000 mL via INTRAVENOUS

## 2019-09-03 MED ORDER — DEXTROSE 10 % IV SOLN
INTRAVENOUS | Status: DC
  Administered 2019-09-03: 11:00:00 75 mL/h via INTRAVENOUS

## 2019-09-03 MED ORDER — AMLODIPINE BESYLATE 5 MG PO TABS
5.0000 mg | ORAL_TABLET | Freq: Every day | ORAL | Status: DC
  Administered 2019-09-04: 10:00:00 5 mg via ORAL
  Filled 2019-09-03: qty 1

## 2019-09-03 MED ORDER — LORAZEPAM 2 MG/ML IJ SOLN
0.5000 mg | INTRAMUSCULAR | Status: DC | PRN
  Administered 2019-09-03 – 2019-09-05 (×3): 0.5 mg via INTRAVENOUS
  Filled 2019-09-03 (×3): qty 1

## 2019-09-03 NOTE — ED Notes (Signed)
Pt sleeping but rouses to be cleaned of stool. Pt cleaned of stoo,l but becomes agitated and combative. New linen placed.

## 2019-09-03 NOTE — ED Notes (Signed)
IV attempted for 18 x 4 by 2 nurses woiithput success.

## 2019-09-03 NOTE — ED Notes (Signed)
CBG 50 RN and MD notified

## 2019-09-03 NOTE — ED Notes (Addendum)
Pt less agitated withput stimuli. Dr. Laren Everts aware of NPO due to Stroke swallow fail and states to make NPO. Also aware of Modified NIH responses and agitation.

## 2019-09-03 NOTE — ED Notes (Signed)
Report given to 3W RN. All questions answered.  

## 2019-09-03 NOTE — ED Provider Notes (Signed)
[facility] EMERGENCY DEPARTMENT Provider Note   CSN: [id number] Arrival date & time: 09/03/19  2423     History   Chief Complaint Chief Complaint  Patient presents with  . Seizures  . Hypoglycemia  . Altered Mental Status    HPI Alexander Strickland is a 73 y.o. male.     73yo M w/ PMH including metastatic pancreatic cancer, HTn, GERD who p/w AMS. Pt's wife called EMS this morning from home for R sided weakness and possible seizure. On EMS arrival, pt was non-verbal, BG 22 therefore they gave 25g D10 with improvement in mentation. He was having some jerking of R arm, possible posturing. They reported he seemed weak on L side compared to R. He received 2.100m versed in route for jerking as he had L gaze preference and there was concern for seizure activity. Wife states he normally walks with assistance and is conversant.  LEVEL 5 CAVEAT DUE TO AMS  The history is provided by the EMS personnel and the spouse.  Seizures Hypoglycemia Associated symptoms: altered mental status and seizures   Altered Mental Status Associated symptoms: seizures     Past Medical History:  Diagnosis Date  . Anemia   . Arthritis   . Family history of cancer   . Family history of cancer   . 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 GERD (gastroesophageal reflux disease)   . Hypertension     Patient Active Problem List   Diagnosis Date Noted  . Genetic testing 03/22/2019  . Family history of cancer   . Primary osteoarthritis of right hip 05/02/2016  . Dizziness - light-headed 07/03/2012  . Bradycardia 07/03/2012  . Hypertension 07/03/2012  . Chest pain 07/03/2012  . Anemia 07/03/2012    Past Surgical History:  Procedure Laterality Date  . JOINT REPLACEMENT     hip  . KNEE SURGERY     Bilateral TKRs  . SHOULDER SURGERY     Left Rotator Cuff Repair  . TOTAL HIP ARTHROPLASTY Right 05/02/2016   Procedure:  RIGHT TOTAL HIP ARTHROPLASTY ANTERIOR APPROACH;  Surgeon: BRod Can MD;  Location: [facility];  Service: Orthopedics;  Laterality: Right;        Home Medications    Prior to Admission medications   Medication Sig Start Date End Date Taking? Authorizing Provider  amLODipine (NORVASC) 5 MG tablet Take 1 tablet (5 mg total) by mouth daily. 05/02/16 02/10/20  Swinteck, BAaron Edelman MD  aspirin 81 MG tablet Take 1 tablet (81 mg total) by mouth 2 (two) times daily after a meal. 05/02/16   Swinteck, BAaron Edelman MD  Cholecalciferol (VITAMIN D3 PO) Take 1 tablet by mouth daily.    [provider]  docusate sodium (COLACE) 100 MG capsule Take 1 capsule (100 mg total) by mouth 2 (two) times daily. 05/02/16   Swinteck, BAaron Edelman MD  ibuprofen (ADVIL,MOTRIN) 800 MG tablet Take 1 tablet (800 mg total) by mouth every 8 (eight) hours as needed. 01/17/18   ZMilton Ferguson MD  naphazoline-pheniramine (NAPHCON-A) 0.025-0.3 % ophthalmic solution Place 1-2 drops into both eyes 4 (four) times daily as needed for irritation or allergies.    [provider]  omeprazole (PRILOSEC OTC) 20 MG tablet Take 20 mg by mouth daily.    [provider]  ondansetron (ZOFRAN) 4 MG tablet Take 1 tablet (4 mg total) by mouth every 8 (eight) hours as needed for nausea or vomiting. 05/02/16  Swinteck, Aaron Edelman, MD  oxyCODONE-acetaminophen (ROXICET) 5-325 MG tablet Take 1-2 tablets by mouth every 4 (four) hours as needed for severe pain. 05/02/16   Swinteck, Aaron Edelman, MD  Oxymetazoline HCl (SINEX ULTRA FINE MIST 12-HOUR NA) Place 2 sprays into the nose every 12 (twelve) hours as needed (For allergies.).    [provider]  traMADol (ULTRAM) 50 MG tablet Take 50 mg by mouth 2 (two) times daily as needed for moderate pain.    [provider]    Family History Family History  Problem Relation Age of Onset  . Coronary artery disease Father   . Diabetes type II Father   . Coronary artery disease Brother   .  Coronary artery disease Sister        X 2  . Diabetes type II Brother   . Prostate cancer Brother 66       currently 13  . Heart failure Mother   . Cancer Mother        in abdominal area  . Breast cancer Mother        deceased 25  . Cancer Maternal Aunt        unk. primary; deceased 43s  . Cancer Maternal Uncle        unk. primary; deceased  . Cancer Cousin        maternal cousins with cancers; unspecified    Social History Social History   Tobacco Use  . Smoking status: Former Smoker    Quit date: 01/19/2000    Years since quitting: 19.6  . Smokeless tobacco: Never Used  Substance Use Topics  . Alcohol use: No    Comment: History of ETOH abuse, no drinks x3 years  . Drug use: No     Allergies   Pollen extract   Review of Systems Review of Systems  Unable to perform ROS: Mental status change  Neurological: Positive for seizures.     Physical Exam Updated Vital Signs BP (!) 142/80   Pulse (!) 104   Temp 98.1 F (36.7 C) (Axillary)   Resp 17   SpO2 99%   Physical Exam Vitals signs and nursing note reviewed.  Constitutional:      General: He is not in acute distress.    Appearance: He is well-developed.     Comments: Thin, frail, chronically ill appearing, altered and mildly agitated in bed  HENT:     Head: Normocephalic and atraumatic.  Eyes:     Extraocular Movements: Extraocular movements intact.     Conjunctiva/sclera: Conjunctivae normal.     Pupils: Pupils are equal, round, and reactive to light.  Neck:     Musculoskeletal: Neck supple.  Cardiovascular:     Rate and Rhythm: Normal rate and regular rhythm.     Heart sounds: Normal heart sounds. No murmur.  Pulmonary:     Effort: Pulmonary effort is normal.     Breath sounds: Normal breath sounds.  Abdominal:     General: Bowel sounds are normal. There is no distension.     Palpations: Abdomen is soft.     Tenderness: There is no abdominal tenderness.  Skin:    General: Skin is warm and  dry.  Neurological:     Comments: Disoriented, not answering questions, awake, trying to roll over onto R side in bed, moving LUE and LLE freely; unable to follow commands  Psychiatric:        Judgment: Judgment normal.      ED Treatments / Results  Labs (all labs ordered are listed, but only abnormal results are displayed) Labs Reviewed  PROTIME-INR - Abnormal; Notable for the following components:      Result Value   Prothrombin Time 19.4 (*)    INR 1.7 (*)    All other components within normal limits  CBC - Abnormal; Notable for the following components:   WBC 18.0 (*)    RBC 2.62 (*)    Hemoglobin 7.3 (*)    HCT 22.7 (*)    RDW 19.6 (*)    Platelets 105 (*)    All other components within normal limits  DIFFERENTIAL - Abnormal; Notable for the following components:   Neutro Abs 15.1 (*)    Monocytes Absolute 1.6 (*)    Abs Immature Granulocytes 0.13 (*)    All other components within normal limits  COMPREHENSIVE METABOLIC PANEL - Abnormal; Notable for the following components:   Sodium 133 (*)    CO2 20 (*)    Calcium 8.1 (*)    Total Protein 5.8 (*)    Albumin 1.9 (*)    AST 87 (*)    ALT 48 (*)    Alkaline Phosphatase 440 (*)    Total Bilirubin 2.1 (*)    All other components within normal limits  RAPID URINE DRUG SCREEN, HOSP PERFORMED - Abnormal; Notable for the following components:   Opiates POSITIVE (*)    Benzodiazepines POSITIVE (*)    All other components within normal limits  CBG MONITORING, ED - Abnormal; Notable for the following components:   Glucose-Capillary 50 (*)    All other components within normal limits  SARS CORONAVIRUS 2 (HOSPITAL ORDER, [facility])  APTT  CBG MONITORING, ED  CBG MONITORING, ED  CBG MONITORING, ED  CBG MONITORING, ED  CBG MONITORING, ED    EKG EKG Interpretation  Date/Time:  Saturday September 03 2019 10:34:52 EDT Ventricular Rate:  120 PR Interval:    QRS Duration: 88 QT Interval:   315 QTC Calculation: 445 R Axis:   73 Text Interpretation:  Sinus tachycardia Multiple ventricular premature complexes tachycardia new from previous Confirmed by Theotis Burrow [phone]) on 09/03/2019 11:08:01 AM   Radiology Ct Head Code Stroke Wo Contrast  Result Date: 09/03/2019 CLINICAL DATA:  Code stroke. Left-sided gaze and right-sided weakness EXAM: CT HEAD WITHOUT CONTRAST TECHNIQUE: Contiguous axial images were obtained from the base of the skull through the vertex without intravenous contrast. COMPARISON:  01/17/2018 FINDINGS: Brain: Moderate area of cortical and white matter low-density in the right occipital lobe. Contiguous superior and anterior low-density with surface mineralization involving a moderate area. No hematoma, hydrocephalus, or solid masslike finding. 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 Vascular: No hyperdense vessel. Skull: Negative Sinuses/Orbits: Negative Other: These results were called by telephone at the time of interpretation on 09/03/2019 at 10:03 am to provider Rory Percy, who verbally acknowledged these results. ASPECTS [facility] Stroke Program Early CT Score) Not scored in the setting of subacute infarct findings. 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 No definite acute infarct. No hematoma. Electronically Signed   By: Monte Fantasia M.D.   On: 09/03/2019 10:07    Procedures .Critical Care Performed by: Sharlett Iles, MD Authorized by: Sharlett Iles, MD   Critical care provider statement:    Critical care time (minutes):  30   Critical care time was exclusive of:  Separately billable procedures and treating other  patients   Critical care was necessary to treat or prevent imminent or life-threatening deterioration of the following conditions:  Endocrine crisis and CNS failure or compromise   Critical care was time spent personally by me on the  following activities:  Development of treatment plan with patient or surrogate, discussions with consultants, evaluation of patient's response to treatment, examination of patient, obtaining history from patient or surrogate, ordering and performing treatments and interventions, ordering and review of laboratory studies, ordering and review of radiographic studies, re-evaluation of patient's condition and review of old charts   (including critical care time)  Medications Ordered in ED Medications  sodium chloride flush (NS) 0.9 % injection 3 mL (has no administration in time range)  dextrose 10 % infusion (75 mL/hr Intravenous New Bag/Given 09/03/19 1103)  iohexol (OMNIPAQUE) 350 MG/ML injection 100 mL (100 mLs Intravenous Contrast Given 09/03/19 1004)  dextrose 50 % solution 25 mL (25 mLs Intravenous Given 09/03/19 1057)  sodium chloride 0.9 % bolus 1,000 mL (1,000 mLs Intravenous New Bag/Given 09/03/19 1154)     Initial Impression / Assessment and Plan / ED Course  I have reviewed the triage vital signs and the nursing notes.  Pertinent labs & imaging results that were available during my care of the patient were reviewed by me and considered in my medical decision making (see chart for details).       Code stroke called in route and pt taken to CT scanner where he was met by stroke team including Dr. Rory Percy. He had initial BG 70s after D10 but it then dropped to 50, therefore initiated D10 drip. It sounds like his mental status immediately improved w/ initial BG correction, suspect that his abnormal behavior and possible seizure were related to hypoglycemia. Pt is on lantus and this may be the reason for his persistent hypoglycemia, especially if PO intake is poor.  Labs notable for WBC 18, INR 1.7, UDS + opiates and benzos, COVID-19 neg. 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 Dr. Rory Percy has recommended medicine admission for hypoglycemia as well as MRI and EEG.  Discussed w/ [facility], Dr. Laren Everts, and pt admitted for further care.  Final Clinical Impressions(s) / ED Diagnoses   Final diagnoses:  None    ED Discharge Orders    None       Jayton Popelka, Wenda Overland, MD 09/03/19 1419

## 2019-09-03 NOTE — H&P (Addendum)
[facility]                                                                                    Patient Demographics  Alexander Strickland, is a 73 y.o. male  CSN: [id number]  MRN: [id number]  DOB - 07-09-1946  Admit Date - 09/03/2019  Outpatient Primary MD for the patient is Patient, No Pcp Per   With History of -  Past Medical History:  Diagnosis Date  . Anemia   . Arthritis   . Family history of cancer   . Family history of cancer   . 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 GERD (gastroesophageal reflux disease)   . Hypertension       Past Surgical History:  Procedure Laterality Date  . JOINT REPLACEMENT     hip  . KNEE SURGERY     Bilateral TKRs  . SHOULDER SURGERY     Left Rotator Cuff Repair  . TOTAL HIP ARTHROPLASTY Right 05/02/2016   Procedure: RIGHT TOTAL HIP ARTHROPLASTY ANTERIOR APPROACH;  Surgeon: Rod Can, MD;  Location: [facility];  Service: Orthopedics;  Laterality: Right;    in for   Chief Complaint  Patient presents with  . Seizures  . Hypoglycemia  . Altered Mental Status     HPI  Alexander Strickland  is a 73 y.o. male, with past medical history significant for metastatic pancreatic cancer, anemia and diabetes mellitus on insulin, presenting with an episode of altered mental status and seizure today early in a.m. patient is on Lantus at home and he was advised to decrease his dose yesterday to 9 units subcu nightly. Today he started having some jerking of right arm with possible posturing, seemed weak on the left side compared to right and received Versed IV for jerking and left gaze preference. Work-up in the emergency room showed Low parietal lobe infarcts with extensive surface mineralization suggesting subacute to remote timing.  Sodium 133 potassium 4.5 alkaline phosphatase 440, white blood cell count 18 hemoglobin 7.3 platelets 105 INR 1.7. Patient was found to be  hypoglycemic in the emergency room and he was started on D10 water To note is that this patient was being cared for at home by hospice however he changed to full code now   Review of Systems    Patient is groggy   Social History Social History   Tobacco Use  . Smoking status: Former Smoker    Quit date: 01/19/2000    Years since quitting: 19.6  . Smokeless tobacco: Never Used  Substance Use Topics  . Alcohol use: No    Comment: History of ETOH abuse, no drinks x3 years     Family History Family History  Problem Relation Age of Onset  . Coronary artery disease Father   . Diabetes type II Father   . Coronary artery disease Brother   . Coronary artery disease Sister        X 2  . Diabetes type II Brother   . Prostate cancer Brother 40       currently 16  . Heart failure  Mother   . Cancer Mother        in abdominal area  . Breast cancer Mother        deceased 51  . Cancer Maternal Aunt        unk. primary; deceased 16s  . Cancer Maternal Uncle        unk. primary; deceased  . Cancer Cousin        maternal cousins with cancers; unspecified     Prior to Admission medications   Medication Sig Start Date End Date Taking? Authorizing Provider  amLODipine (NORVASC) 5 MG tablet Take 1 tablet (5 mg total) by mouth daily. 05/02/16 02/10/20  Swinteck, Aaron Edelman, MD  aspirin 81 MG tablet Take 1 tablet (81 mg total) by mouth 2 (two) times daily after a meal. 05/02/16   Swinteck, Aaron Edelman, MD  Cholecalciferol (VITAMIN D3 PO) Take 1 tablet by mouth daily.    [provider]  docusate sodium (COLACE) 100 MG capsule Take 1 capsule (100 mg total) by mouth 2 (two) times daily. 05/02/16   Swinteck, Aaron Edelman, MD  ibuprofen (ADVIL,MOTRIN) 800 MG tablet Take 1 tablet (800 mg total) by mouth every 8 (eight) hours as needed. 01/17/18   Milton Ferguson, MD  naphazoline-pheniramine (NAPHCON-A) 0.025-0.3 % ophthalmic solution Place 1-2 drops into both eyes 4 (four) times daily as needed for  irritation or allergies.    [provider]  omeprazole (PRILOSEC OTC) 20 MG tablet Take 20 mg by mouth daily.    [provider]  ondansetron (ZOFRAN) 4 MG tablet Take 1 tablet (4 mg total) by mouth every 8 (eight) hours as needed for nausea or vomiting. 05/02/16   Swinteck, Aaron Edelman, MD  oxyCODONE-acetaminophen (ROXICET) 5-325 MG tablet Take 1-2 tablets by mouth every 4 (four) hours as needed for severe pain. 05/02/16   Swinteck, Aaron Edelman, MD  Oxymetazoline HCl (SINEX ULTRA FINE MIST 12-HOUR NA) Place 2 sprays into the nose every 12 (twelve) hours as needed (For allergies.).    [provider]  traMADol (ULTRAM) 50 MG tablet Take 50 mg by mouth 2 (two) times daily as needed for moderate pain.    [provider]    Allergies  Allergen Reactions  . Pollen Extract     Seasonal allergies    Physical Exam  Vitals  Blood pressure (!) 142/80, pulse (!) 104, temperature 98.1 F (36.7 C), temperature source Axillary, resp. rate 17, SpO2 99 %.  General appearance, groggy, chronically ill GA no jaundice or pallor, no facial deviation oral thrush Neck supple Chest clear and resonant Heart normal S1-S2, no murmurs gallops rubs Abdomen soft, mild tenderness, bowel sounds are present Extremities no clubbing cyanosis or edema . Skin no rashes or ulcers Neuro, grossly nonfocal    Data Review  CBC Recent Labs  Lab 09/03/19 1225  WBC 18.0*  HGB 7.3*  HCT 22.7*  PLT 105*  MCV 86.6  MCH 27.9  MCHC 32.2  RDW 19.6*  LYMPHSABS 1.1  MONOABS 1.6*  EOSABS 0.0  BASOSABS 0.0   ------------------------------------------------------------------------------------------------------------------  Chemistries  Recent Labs  Lab 09/03/19 1225  NA 133*  K 4.5  CL 106  CO2 20*  GLUCOSE 97  BUN 21  CREATININE 1.08  CALCIUM 8.1*  AST 87*  ALT 48*  ALKPHOS 440*  BILITOT 2.1*    ------------------------------------------------------------------------------------------------------------------ CrCl cannot be calculated (Unknown ideal weight.). ------------------------------------------------------------------------------------------------------------------ No results for input(s): TSH, T4TOTAL, T3FREE, THYROIDAB in the last 72 hours.  Invalid input(s): FREET3   Coagulation profile Recent  Labs  Lab 09/03/19 1225  INR 1.7*   ------------------------------------------------------------------------------------------------------------------- No results for input(s): DDIMER in the last 72 hours. -------------------------------------------------------------------------------------------------------------------  Cardiac Enzymes No results for input(s): CKMB, TROPONINI, MYOGLOBIN in the last 168 hours.  Invalid input(s): CK ------------------------------------------------------------------------------------------------------------------ Invalid input(s): POCBNP   ---------------------------------------------------------------------------------------------------------------  Urinalysis    Component Value Date/Time   COLORURINE YELLOW 07/03/2012 [place] 07/03/2012 1645   LABSPEC 1.027 07/03/2012 1645   PHURINE 5.5 07/03/2012 1645   GLUCOSEU 100 (A) 07/03/2012 1645   HGBUR NEGATIVE 07/03/2012 [place] 07/03/2012 1645   KETONESUR NEGATIVE 07/03/2012 1645   PROTEINUR NEGATIVE 07/03/2012 1645   UROBILINOGEN 0.2 07/03/2012 1645   NITRITE NEGATIVE 07/03/2012 1645   LEUKOCYTESUR NEGATIVE 07/03/2012 1645    ----------------------------------------------------------------------------------------------------------------   Imaging results:   Ct Head Code Stroke Wo Contrast  Result Date: 09/03/2019 CLINICAL DATA:  Code stroke. Left-sided gaze and right-sided weakness EXAM: CT HEAD WITHOUT CONTRAST TECHNIQUE: Contiguous  axial images were obtained from the base of the skull through the vertex without intravenous contrast. COMPARISON:  01/17/2018 FINDINGS: Brain: Moderate area of cortical and white matter low-density in the right occipital lobe. Contiguous superior and anterior low-density with surface mineralization involving a moderate area. No hematoma, hydrocephalus, or solid masslike finding. 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 Vascular: No hyperdense vessel. Skull: Negative Sinuses/Orbits: Negative Other: These results were called by telephone at the time of interpretation on 09/03/2019 at 10:03 am to provider Rory Percy, who verbally acknowledged these results. ASPECTS [facility] Stroke Program Early CT Score) Not scored in the setting of subacute infarct findings. 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 No definite acute infarct. No hematoma. Electronically Signed   By: Monte Fantasia M.D.   On: 09/03/2019 10:07        Assessment & Plan  Hypoglycemia with altered mental status with a long-acting insulin/Lantus Start patient on D10 water Monitor blood sugar by fingerstick every 2 hours  Metastatic Pancreatic cancer Consult palliative care in a.m. 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  Code Status full  Disposition Plan: Home  Time spent in minutes : 42 minutes  Condition GUARDED   @SIGNATURE @

## 2019-09-03 NOTE — ED Notes (Signed)
Pt arrives CT

## 2019-09-03 NOTE — ED Notes (Signed)
Pt much more calm.

## 2019-09-03 NOTE — Consult Note (Addendum)
Neurology Consultation  Reason for Consult: Acute code stroke Referring Physician: Dr. Rex Kras  CC: Seizure activity, right-sided weakness  History is obtained from: Chart review.  Attempted to reach wife-unable to reach  HPI: Alexander Strickland is a 73 y.o. male past medical history metastatic pancreatic cancer, anemia, arthritis, diabetes, presenting from home as an acute code stroke for possible seizure-like activity and altered mental status. Last seen normal to the best of our history taking by EMS at 3 AM when he walk to the bathroom.  This morning noted by family to be not acting right.  When EMS arrived, his blood sugars were noted to be 22.  They given D50, after that he had a brief seizure-like activity that generalized tonic-clonic with leftward gaze.  After that he would not move his right arm as much for EMS.  His strength started to improve some on the way to the hospital but his mentation remained poor.  Code stroke was activated for that reason. Patient is unable to provide any history. I attempted to reach the wife over the phone on the number listed in the chart with no answer. I also reached out to [facility]-that is listed on the chart as he is a hospice patient.  Of note, diabetes medications have been recently changed.  According to the hospitalist, Lantus dose was adjusted due to frequent hypoglycemic spells.  LKW: 0300 today tpa given?: no, subacute stroke on CT head Premorbid modified Rankin scale (mRS): Unable to ascertain but according to EMS walks and talks normally.  ROS: ROS was performed and is negative except as noted in the HPI.   Past Medical History:  Diagnosis Date  . Anemia   . Arthritis   . Family history of cancer   . Family history of cancer   . 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 GERD (gastroesophageal reflux disease)   . Hypertension     Family History  Problem Relation Age  of Onset  . Coronary artery disease Father   . Diabetes type II Father   . Coronary artery disease Brother   . Coronary artery disease Sister        X 2  . Diabetes type II Brother   . Prostate cancer Brother 66       currently 35  . Heart failure Mother   . Cancer Mother        in abdominal area  . Breast cancer Mother        deceased 68  . Cancer Maternal Aunt        unk. primary; deceased 33s  . Cancer Maternal Uncle        unk. primary; deceased  . Cancer Cousin        maternal cousins with cancers; unspecified   Social History:   reports that he quit smoking about 19 years ago. He has never used smokeless tobacco. He reports that he does not drink alcohol or use drugs. Patient unable to provide.  Medications No current facility-administered medications for this encounter.   Current Outpatient Medications:  .  amLODipine (NORVASC) 5 MG tablet, Take 1 tablet (5 mg total) by mouth daily., Disp: 30 tablet, Rfl: 0 .  aspirin 81 MG tablet, Take 1 tablet (81 mg total) by mouth 2 (two) times daily after a meal., Disp: 60 tablet, Rfl: 1 .  Cholecalciferol (VITAMIN D3 PO), Take 1 tablet by mouth daily., Disp: ,  Rfl:  .  docusate sodium (COLACE) 100 MG capsule, Take 1 capsule (100 mg total) by mouth 2 (two) times daily., Disp: 60 capsule, Rfl: 3 .  ibuprofen (ADVIL,MOTRIN) 800 MG tablet, Take 1 tablet (800 mg total) by mouth every 8 (eight) hours as needed., Disp: 15 tablet, Rfl: 0 .  naphazoline-pheniramine (NAPHCON-A) 0.025-0.3 % ophthalmic solution, Place 1-2 drops into both eyes 4 (four) times daily as needed for irritation or allergies., Disp: , Rfl:  .  omeprazole (PRILOSEC OTC) 20 MG tablet, Take 20 mg by mouth daily., Disp: , Rfl:  .  ondansetron (ZOFRAN) 4 MG tablet, Take 1 tablet (4 mg total) by mouth every 8 (eight) hours as needed for nausea or vomiting., Disp: 20 tablet, Rfl: 0 .  oxyCODONE-acetaminophen (ROXICET) 5-325 MG tablet, Take 1-2 tablets by mouth every 4 (four)  hours as needed for severe pain., Disp: 90 tablet, Rfl: 0 .  Oxymetazoline HCl (SINEX ULTRA FINE MIST 12-HOUR NA), Place 2 sprays into the nose every 12 (twelve) hours as needed (For allergies.)., Disp: , Rfl:  .  traMADol (ULTRAM) 50 MG tablet, Take 50 mg by mouth 2 (two) times daily as needed for moderate pain., Disp: , Rfl:    Exam: Current vital signs: There were no vitals taken for this visit. Vital signs in last 24 hours:   General: Awake alert, thrashing about HEENT: Cephalic atraumatic Lungs: Chest clear to auscultation, right chest port palpated. Abdomen: Nondistended nontender Neurological exam Patient is awake, alert, not following commands.  Appears agitated. He is nonverbal.  Only grunting. Cranial: Pupils appear equal, round reactive to light, he has a leftward gaze preference but has spontaneous movement of eyes to the right.  He does not blink to threat from either side.  Facial symmetry difficult to ascertain. Motor: Weaker on the right based on withdrawal to noxious stimulation.  Strong withdrawal on the left. Sensory exam: As above Coordination and gait cannot be tested   NIHSS 1a Level of Conscious.: 0 1b LOC Questions: 2 1c LOC Commands: 2 2 Best Gaze: 1 3 Visual: 0 4 Facial Palsy: 0 5a Motor Arm - left: 0 5b Motor Arm - Right: 2 6a Motor Leg - Left: 0 6b Motor Leg - Right: 1 7 Limb Ataxia: 0 8 Sensory: 0 9 Best Language: 3 10 Dysarthria: 2 11 Extinct. and Inatten.: 0 TOTAL: 13    Labs I have reviewed labs in epic and the results pertinent to this consultation are: CBC    Component Value Date/Time   WBC 7.6 05/05/2016 0353   RBC 3.11 (L) 05/05/2016 0353   HGB 15.0 01/17/2018 1334   HCT 44.0 01/17/2018 1334   PLT 250 05/05/2016 0353   MCV 85.9 05/05/2016 0353   MCH 28.0 05/05/2016 0353   MCHC 32.6 05/05/2016 0353   RDW 13.3 05/05/2016 0353   LYMPHSABS 1.8 07/03/2012 1609   MONOABS 0.5 07/03/2012 1609   EOSABS 0.2 07/03/2012 1609    BASOSABS 0.0 07/03/2012 1609    CMP     Component Value Date/Time   NA 139 01/17/2018 1334   K 5.3 (H) 01/17/2018 1334   CL 103 01/17/2018 1334   CO2 24 05/03/2016 0357   GLUCOSE 136 (H) 01/17/2018 1334   BUN 21 (H) 01/17/2018 1334   CREATININE 1.00 01/17/2018 1334   CALCIUM 8.6 (L) 05/03/2016 0357   PROT 7.1 03/07/2008 1435   ALBUMIN 4.0 03/07/2008 1435   AST 25 03/07/2008 1435   ALT 31 03/07/2008 1435  ALKPHOS 71 03/07/2008 1435   BILITOT 0.7 03/07/2008 1435   GFRNONAA 57 (L) 05/03/2016 0357   GFRAA >60 05/03/2016 0357  Imaging I have reviewed the images obtained:  CT-scan of the brain-moderate-sized infarction of the right occipital lobe and low parietal lobes with extensive surface mineralization suggesting subacute to remote timing.  No definitive acute infarct.  No hematoma.  Assessment:  73 year old man with above past medical history brought in for concern for seizure versus stroke when he was found to be less responsive, gazing to the left and weak on the right side.  Symptoms started to improve on the way to the hospital. Of note-on EMS arrival- fingerstick blood glucose was 22.  Seizure happened after correction of the hypoglycemia. Remained encephalopathic on exam in the emergency room. Noncontrast head CT suggestive of a possible subacute to chronic stroke in the right parieto-occipital territory.  Impression: Evaluate for stroke Evaluate for seizure Toxic metabolic encephalopathy  Recommendations: -Stat CTA head and neck -Stat perfusion study -We will need EEG-stat EEG ordered.  Technologist will be available in the next 1 to 2 hours due to prior commitments and already running studies. -Awaiting call from family as well as hospice care.  -- Amie Portland, MD [facility] Pager: [phone] If 7pm to 7am, please call on call as listed on AMION.   CRITICAL CARE ATTESTATION Performed by: Amie Portland, MD Total critical care time: 50  minutes Critical care time was exclusive of separately billable procedures and treating other patients and/or supervising APPs/Residents/Students Critical care was necessary to treat or prevent imminent or life-threatening deterioration due to concern for stroke, seizure. Toxic metabolic encephelopathy This patient is critically ill and at significant risk for neurological worsening and/or death and care requires constant monitoring. Critical care was time spent personally by me on the following activities: development of treatment plan with patient and/or surrogate as well as nursing, discussions with consultants, evaluation of patient's response to treatment, examination of patient, obtaining history from patient or surrogate, ordering and performing treatments and interventions, ordering and review of laboratory studies, ordering and review of radiographic studies, pulse oximetry, re-evaluation of patient's condition, participation in multidisciplinary rounds and medical decision making of high complexity in the care of this patient.    Addendum Difficult access, CTA could not be performed. Likely hypoglycemia, seizure-induced by hypoglycemia versus toxic metabolic encephalopathy  Updated recommendations: -EEG pending -Management of hypoglycemia per primary team We will continue to follow.  -- Amie Portland, MD [facility] Pager: [phone] If 7pm to 7am, please call on call as listed on AMION.   Addendum EEG completed Generalized slowing and left occipital slowing consistent with the changes seen on the CT No seizures   Updated recommendations:  Needs MRI brain with and without contrast-to confirm the infarct and to dated. Do not see a need for antiepileptics at this time as the seizure was provoked with hypoglycemia Continue management of hypoglycemia per primary team We will follow.   -- Amie Portland, MD [facility] Pager: [phone] If 7pm to  7am, please call on call as listed on AMION.

## 2019-09-03 NOTE — ED Notes (Signed)
Pt sleeping. Rouses briefly to voice but returns to sleep.

## 2019-09-03 NOTE — ED Notes (Signed)
EEG at bedside.

## 2019-09-03 NOTE — Procedures (Signed)
Patient Name: Alexander Strickland  MRN: [id number]  Epilepsy Attending: Lora Havens  Referring Physician/Provider: Dr Amie Portland Date: 09/03/2019 Duration: 23.33 mins  Patient history: 73yo M who presented with decreased responsiveness, gazing to the left and weak on the right side. EEG to evaluate for seizure  Level of alertness: awake, lethargic  AEDs during EEG study: None  Technical aspects: This EEG study was done with scalp electrodes positioned according to the 10-20 International system of electrode placement. Electrical activity was acquired at a sampling rate of 500Hz  and reviewed with a high frequency filter of 70Hz  and a low frequency filter of 1Hz . EEG data were recorded continuously and digitally stored.   DESCRIPTION: The posterior dominant rhythm consists of 6-7 Hz activity of moderate voltage (25-35 uV) seen predominantly in posterior head regions, symmetric and reactive to eye opening and eye closing. There was also continuous generalized 2-5hz  theta-delta slowing, maximal right occipital region. Hyperventilation and photic stimulation were not performed.  ABNORMALITY: 1. Continuous slow, generalized, maximal right occipital 2. Background slow  IMPRESSION: This study is suggestive of cortical dysfunction in right occipital region consistent with underlying infarct. There is also evidence of moderate diffuse encephalopathy. No seizures or epileptiform discharges were seen throughout the recording.  Jody Silas Barbra Sarks

## 2019-09-03 NOTE — ED Notes (Signed)
Pt c/o pain And becoming more agitated Dr. Laren Everts aware.

## 2019-09-03 NOTE — ED Notes (Signed)
ED TO INPATIENT HANDOFF REPORT  ED Nurse Name and Phone #: [facility] [phone]  S Name/Age/Gender Alexander Strickland 73 y.o. male Room/Bed: 019C/019C  Code Status   Code Status: Full Code  Home/SNF/Other Home Patient oriented to: self Is this baseline? No   Triage Complete: Triage complete  Chief Complaint code stroke  Triage Note Pt arrives EMS from home with c/o seizure and right sided weakness with left preferred gaze. Pt CBG I nitially 22 by EMS and given 25 grams d10. Pt given 2.5 mg midazelam pta. Pt moaning ansd moving all extremioties. Left saide greater than right. Pt follows some commands, iv at rightr forearm.Pt arrivesd at [phone] and was met at bridge by Dr. Malen Gauze and rapid response nurse then taken to CT 2.   Allergies Allergies  Allergen Reactions  . Pollen Extract     Seasonal allergies    Level of Care/Admitting Diagnosis ED Disposition    ED Disposition Condition [facility]: [place] [[id number]]  Level of Care: Telemetry Medical [104]  Covid Evaluation: Asymptomatic Screening Protocol (No Symptoms)  Diagnosis: Hypoglycemia [599357]  Admitting Physician: [place] Border Marshal.Browner  Attending Physician: Laren Everts, [place]  Estimated length of stay: past midnight tomorrow  Certification:: I certify this patient will need inpatient services for at least 2 midnights  PT Class (Do Not Modify): Inpatient [101]  PT Acc Code (Do Not Modify): Private [1]       B Medical/Surgery History Past Medical History:  Diagnosis Date  . Anemia   . Arthritis   . Family history of cancer   . Family history of cancer   . 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 GERD (gastroesophageal reflux disease)   . Hypertension    Past Surgical History:  Procedure Laterality Date  . JOINT REPLACEMENT     hip  . KNEE SURGERY     Bilateral TKRs  . SHOULDER SURGERY     Left Rotator Cuff  Repair  . TOTAL HIP ARTHROPLASTY Right 05/02/2016   Procedure: RIGHT TOTAL HIP ARTHROPLASTY ANTERIOR APPROACH;  Surgeon: Rod Can, MD;  Location: [facility];  Service: Orthopedics;  Laterality: Right;     A IV Location/Drains/Wounds Patient Lines/Drains/Airways Status   Active Line/Drains/Airways    Name:   Placement date:   Placement time:   Site:   Days:   Implanted Port 09/03/19 Right Chest   09/03/19    1225    Chest   less than 1   Peripheral IV 07/03/12 Right Antecubital   07/03/12    1700    Antecubital   2618   Peripheral IV 05/02/16 Left Wrist   05/02/16    0715    Wrist   1219   Peripheral IV 09/03/19 Right Forearm   09/03/19    1032    Forearm   less than 1   Incision (Closed) 05/02/16 Hip Right   05/02/16    0945     1219          Intake/Output Last 24 hours No intake or output data in the 24 hours ending 09/03/19 1626  Labs/Imaging Results for orders placed or performed during the hospital encounter of 09/03/19 (from the past 48 hour(s))  CBG monitoring, ED     Status: None   Collection Time: 09/03/19  9:48 AM  Result Value Ref Range   Glucose-Capillary 73 70 - 99 mg/dL  Comment 1 Notify RN    Comment 2 Document in Chart   CBG monitoring, ED     Status: Abnormal   Collection Time: 09/03/19 10:45 AM  Result Value Ref Range   Glucose-Capillary 50 (L) 70 - 99 mg/dL   Comment 1 Notify RN    Comment 2 Document in Chart   CBG monitoring, ED     Status: None   Collection Time: 09/03/19 11:06 AM  Result Value Ref Range   Glucose-Capillary 77 70 - 99 mg/dL   Comment 1 Notify RN    Comment 2 Document in Chart   SARS Coronavirus 2 [facility] order, Performed in [facility] hospital lab) Nasopharyngeal Nasopharyngeal Swab     Status: None   Collection Time: 09/03/19 11:57 AM   Specimen: Nasopharyngeal Swab  Result Value Ref Range   SARS Coronavirus 2 NEGATIVE NEGATIVE    Comment: (NOTE) If result is NEGATIVE SARS-CoV-2 target nucleic acids are NOT DETECTED. The  SARS-CoV-2 RNA is generally detectable in upper and lower  respiratory specimens during the acute phase of infection. The lowest  concentration of SARS-CoV-2 viral copies this assay can detect is 250  copies / mL. A negative result does not preclude SARS-CoV-2 infection  and should not be used as the sole basis for treatment or other  patient management decisions.  A negative result may occur with  improper specimen collection / handling, submission of specimen other  than nasopharyngeal swab, presence of viral mutation(s) within the  areas targeted by this assay, and inadequate number of viral copies  (<250 copies / mL). A negative result must be combined with clinical  observations, patient history, and epidemiological information. If result is POSITIVE SARS-CoV-2 target nucleic acids are DETECTED. The SARS-CoV-2 RNA is generally detectable in upper and lower  respiratory specimens dur ing the acute phase of infection.  Positive  results are indicative of active infection with SARS-CoV-2.  Clinical  correlation with patient history and other diagnostic information is  necessary to determine patient infection status.  Positive results do  not rule out bacterial infection or co-infection with other viruses. If result is PRESUMPTIVE POSTIVE SARS-CoV-2 nucleic acids MAY BE PRESENT.   A presumptive positive result was obtained on the submitted specimen  and confirmed on repeat testing.  While 2019 novel coronavirus  (SARS-CoV-2) nucleic acids may be present in the submitted sample  additional confirmatory testing may be necessary for epidemiological  and / or clinical management purposes  to differentiate between  SARS-CoV-2 and other Sarbecovirus currently known to infect humans.  If clinically indicated additional testing with an alternate test  methodology [phone]) is advised. The SARS-CoV-2 RNA is generally  detectable in upper and lower respiratory sp ecimens during the acute   phase of infection. The expected result is Negative. Fact Sheet for Patients:  StrictlyIdeas.no Fact Sheet for Healthcare Providers: BankingDealers.co.za This test is not yet approved or cleared by the [place] FDA and has been authorized for detection and/or diagnosis of SARS-CoV-2 by FDA under an Emergency Use Authorization (EUA).  This EUA will remain in effect (meaning this test can be used) for the duration of the COVID-19 declaration under Section 564(b)(1) of the Act, 21 U.S.C. section 360bbb-3(b)(1), unless the authorization is terminated or revoked sooner. Performed at [facility], [place] [address] [place], [place] [address]   Urine rapid drug screen (hosp performed)     Status: Abnormal   Collection Time: 09/03/19 11:57 AM  Result Value Ref Range  Opiates POSITIVE (A) NONE DETECTED   Cocaine NONE DETECTED NONE DETECTED   Benzodiazepines POSITIVE (A) NONE DETECTED   Amphetamines NONE DETECTED NONE DETECTED   Tetrahydrocannabinol NONE DETECTED NONE DETECTED   Barbiturates NONE DETECTED NONE DETECTED    Comment: (NOTE) DRUG SCREEN FOR MEDICAL PURPOSES ONLY.  IF CONFIRMATION IS NEEDED FOR ANY PURPOSE, NOTIFY LAB WITHIN 5 DAYS. LOWEST DETECTABLE LIMITS FOR URINE DRUG SCREEN Drug Class                     Cutoff (ng/mL) Amphetamine and metabolites    1000 Barbiturate and metabolites    200 Benzodiazepine                 161 Tricyclics and metabolites     300 Opiates and metabolites        300 Cocaine and metabolites        300 THC                            50 Performed at [facility], [place] [address] [place], [place] [address]   CBG monitoring, ED     Status: None   Collection Time: 09/03/19 11:58 AM  Result Value Ref Range   Glucose-Capillary 71 70 - 99 mg/dL   Comment 1 Notify RN    Comment 2 Document in Chart   Protime-INR     Status: Abnormal   Collection Time: 09/03/19 12:25 PM  Result Value  Ref Range   Prothrombin Time 19.4 (H) 11.4 - 15.2 seconds   INR 1.7 (H) 0.8 - 1.2    Comment: (NOTE) INR goal varies based on device and disease states. Performed at [facility], [place] [address], [place], [place] [address]   APTT     Status: None   Collection Time: 09/03/19 12:25 PM  Result Value Ref Range   aPTT 36 24 - 36 seconds    Comment: Performed at [facility] [address], [place], [place] [address]  CBC     Status: Abnormal   Collection Time: 09/03/19 12:25 PM  Result Value Ref Range   WBC 18.0 (H) 4.0 - 10.5 K/uL   RBC 2.62 (L) 4.22 - 5.81 MIL/uL   Hemoglobin 7.3 (L) 13.0 - 17.0 g/dL   HCT 22.7 (L) 39.0 - 52.0 %   MCV 86.6 80.0 - 100.0 fL   MCH 27.9 26.0 - 34.0 pg   MCHC 32.2 30.0 - 36.0 g/dL   RDW 19.6 (H) 11.5 - 15.5 %   Platelets 105 (L) 150 - 400 K/uL    Comment: REPEATED TO VERIFY PLATELET COUNT CONFIRMED BY SMEAR SPECIMEN CHECKED FOR CLOTS    nRBC 0.0 0.0 - 0.2 %    Comment: Performed at [facility], [place] [address] [place], [place] [address]  Differential     Status: Abnormal   Collection Time: 09/03/19 12:25 PM  Result Value Ref Range   Neutrophils Relative % 84 %   Neutro Abs 15.1 (H) 1.7 - 7.7 K/uL   Lymphocytes Relative 6 %   Lymphs Abs 1.1 0.7 - 4.0 K/uL   Monocytes Relative 9 %   Monocytes Absolute 1.6 (H) 0.1 - 1.0 K/uL   Eosinophils Relative 0 %   Eosinophils Absolute 0.0 0.0 - 0.5 K/uL   Basophils Relative 0 %   Basophils Absolute 0.0 0.0 - 0.1 K/uL   Immature Granulocytes 1 %  Abs Immature Granulocytes 0.13 (H) 0.00 - 0.07 K/uL   Schistocytes PRESENT    Polychromasia PRESENT     Comment: Performed at [facility], [place] [address], [place], [place] [address]  Comprehensive metabolic panel     Status: Abnormal   Collection Time: 09/03/19 12:25 PM  Result Value Ref Range   Sodium 133 (L) 135 - 145 mmol/L   Potassium 4.5 3.5 - 5.1 mmol/L   Chloride 106 98 - 111 mmol/L   CO2 20 (L) 22 - 32 mmol/L   Glucose,  Bld 97 70 - 99 mg/dL   BUN 21 8 - 23 mg/dL   Creatinine, Ser 1.08 0.61 - 1.24 mg/dL   Calcium 8.1 (L) 8.9 - 10.3 mg/dL   Total Protein 5.8 (L) 6.5 - 8.1 g/dL   Albumin 1.9 (L) 3.5 - 5.0 g/dL   AST 87 (H) 15 - 41 U/L   ALT 48 (H) 0 - 44 U/L   Alkaline Phosphatase 440 (H) 38 - 126 U/L   Total Bilirubin 2.1 (H) 0.3 - 1.2 mg/dL   GFR calc non Af Amer >60 >60 mL/min   GFR calc Af Amer >60 >60 mL/min   Anion gap 7 5 - 15    Comment: Performed at [facility], [place] [address], [place], [place] [address]  CBG monitoring, ED     Status: None   Collection Time: 09/03/19 12:55 PM  Result Value Ref Range   Glucose-Capillary 76 70 - 99 mg/dL   Comment 1 Notify RN    Comment 2 Document in Chart   CBG monitoring, ED     Status: None   Collection Time: 09/03/19  2:24 PM  Result Value Ref Range   Glucose-Capillary 88 70 - 99 mg/dL   Comment 1 Notify RN    Comment 2 Document in Chart   CBG monitoring, ED     Status: None   Collection Time: 09/03/19  3:12 PM  Result Value Ref Range   Glucose-Capillary 90 70 - 99 mg/dL   Comment 1 Notify RN    Comment 2 Document in Chart   CBG monitoring, ED     Status: None   Collection Time: 09/03/19  4:00 PM  Result Value Ref Range   Glucose-Capillary 80 70 - 99 mg/dL   Comment 1 Notify RN    Comment 2 Document in Chart    Ct Head Code Stroke Wo Contrast  Result Date: 09/03/2019 CLINICAL DATA:  Code stroke. Left-sided gaze and right-sided weakness EXAM: CT HEAD WITHOUT CONTRAST TECHNIQUE: Contiguous axial images were obtained from the base of the skull through the vertex without intravenous contrast. COMPARISON:  01/17/2018 FINDINGS: Brain: Moderate area of cortical and white matter low-density in the right occipital lobe. Contiguous superior and anterior low-density with surface mineralization involving a moderate area. No hematoma, hydrocephalus, or solid masslike finding. 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 Vascular: No hyperdense vessel. Skull:  Negative Sinuses/Orbits: Negative Other: These results were called by telephone at the time of interpretation on 09/03/2019 at 10:03 am to provider Rory Percy, who verbally acknowledged these results. ASPECTS [facility] Stroke Program Early CT Score) Not scored in the setting of subacute infarct findings. 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 No definite acute infarct. No hematoma. Electronically Signed   By: Monte Fantasia M.D.   On: 09/03/2019 10:07    Pending Labs Unresulted Labs (From admission, onward)  None      Vitals/Pain Today's Vitals   09/03/19 1515 09/03/19 1530 09/03/19 1545 09/03/19 1600  BP: 135/88 (!) 143/87 (!) 145/68 (!) 139/91  Pulse: (!) 109 (!) 107 (!) 124 (!) 117  Resp:    20  Temp:      TempSrc:      SpO2: 99% 100% 100% 97%  PainSc:        Isolation Precautions No active isolations  Medications Medications  sodium chloride flush (NS) 0.9 % injection 3 mL (3 mLs Intravenous Not Given 09/03/19 1459)  dextrose 10 % infusion (has no administration in time range)  aspirin tablet 81 mg (has no administration in time range)  oxyCODONE-acetaminophen (PERCOCET/ROXICET) 5-325 MG per tablet 1-2 tablet (has no administration in time range)  amLODipine (NORVASC) tablet 5 mg (has no administration in time range)  Vitamin D3 (Vitamin D) tablet 1,000 Units (has no administration in time range)  docusate sodium (COLACE) capsule 100 mg (has no administration in time range)  omeprazole (PRILOSEC OTC) EC tablet 20 mg (has no administration in time range)  ondansetron (ZOFRAN) tablet 4 mg (has no administration in time range)  naphazoline-pheniramine (NAPHCON-A) 0.025-0.3 % ophthalmic solution 1 drop (has no administration in time range)  enoxaparin (LOVENOX) injection 40 mg (has no administration in time range)  HYDROmorphone (DILAUDID) injection 1 mg (1 mg Intravenous  Given 09/03/19 1623)  oxyCODONE (Oxy IR/ROXICODONE) immediate release tablet 5 mg (has no administration in time range)  LORazepam (ATIVAN) injection 0.5 mg (has no administration in time range)  iohexol (OMNIPAQUE) 350 MG/ML injection 100 mL (100 mLs Intravenous Contrast Given 09/03/19 1004)  dextrose 50 % solution 25 mL (25 mLs Intravenous Given 09/03/19 1057)  sodium chloride 0.9 % bolus 1,000 mL (0 mLs Intravenous Stopped 09/03/19 1424)    Mobility walks with device High fall risk   Focused Assessments Neuro Assessment Handoff:  Swallow screen pass? No  Cardiac Rhythm: Sinus tachycardia NIH Stroke Scale ( + Modified Stroke Scale Criteria)  Interval: Initial Level of Consciousness (1a.)   : Alert, keenly responsive LOC Questions (1b. )   +: Answers neither question correctly LOC Commands (1c. )   + : Performs both tasks correctly Best Gaze (2. )  +: Normal Visual (3. )  +: No visual loss Facial Palsy (4. )    : Normal symmetrical movements Motor Arm, Left (5a. )   +: No drift Motor Arm, Right (5b. )   +: No drift Motor Leg, Left (6a. )   +: No drift Motor Leg, Right (6b. )   +: No drift Limb Ataxia (7. ): Absent(does not follow command but moves extremities.) Sensory (8. )   +: Normal, no sensory loss Best Language (9. )   +: Mild-to-moderate aphasia Dysarthria (10. ): Severe dysarthria, patient's speech is so slurred as to be unintelligible in the absence of or out of proportion to any dysphasia, or is mute/anarthric Extinction/Inattention (11.)   +: No Abnormality(Pt recognizes both sides but has difficulty following command.) Modified SS Total  +: 3 Complete NIHSS TOTAL: 16     Neuro Assessment: Exceptions to WDL Neuro Checks:   Initial (09/03/19 1050)  Last Documented NIHSS Modified Score: 3 (09/03/19 1426) Has TPA been given? No If patient is a Neuro Trauma and patient is going to OR before floor call report to [place] nurse: [phone] or  [phone]     R Recommendations: See Admitting Provider Note  Report given to:   Additional Notes:  Pt just given Dilaudid hospice pt with terminal pancreatic Ca and full code.

## 2019-09-03 NOTE — ED Triage Notes (Addendum)
Pt arrives EMS from home with c/o seizure and right sided weakness with left preferred gaze. Pt CBG I nitially 22 by EMS and given 25 grams d10. Pt given 2.5 mg midazelam pta. Pt moaning ansd moving all extremioties. Left saide greater than right. Pt follows some commands, iv at rightr forearm.Pt arrivesd at [phone] and was met at bridge by Dr. Malen Gauze and rapid response nurse then taken to CT 2.

## 2019-09-03 NOTE — ED Notes (Signed)
Stroke swallow attempted x 2 . Pt has no difficulty swallowuing but stops before finished. Uncertain if he can follow commands.

## 2019-09-03 NOTE — Progress Notes (Signed)
EEG Completed; Results Pending  

## 2019-09-03 NOTE — ED Notes (Signed)
Pt more calm and alert but is unaware of where he is. Famioy at bedside. Pt moves allextremities. Denies pIN.

## 2019-09-03 NOTE — Progress Notes (Signed)
Attempted to complete pt.'s MRI, once pt was put in the scanner he started flailing his arms and trying to climb out of the scanner. Pt then became combative and was swinging his arms at staff and trying to climb off the gurney. Pt was taken back to ED AND RN notified.

## 2019-09-04 ENCOUNTER — Inpatient Hospital Stay (HOSPITAL_COMMUNITY)

## 2019-09-04 DIAGNOSIS — I6389 Other cerebral infarction: Secondary | ICD-10-CM

## 2019-09-04 DIAGNOSIS — R4182 Altered mental status, unspecified: Secondary | ICD-10-CM

## 2019-09-04 DIAGNOSIS — Z515 Encounter for palliative care: Secondary | ICD-10-CM

## 2019-09-04 LAB — GLUCOSE, CAPILLARY
Glucose-Capillary: 107 mg/dL — ABNORMAL HIGH (ref 70–99)
Glucose-Capillary: 134 mg/dL — ABNORMAL HIGH (ref 70–99)
Glucose-Capillary: 124 mg/dL — ABNORMAL HIGH (ref 70–99)
Glucose-Capillary: 167 mg/dL — ABNORMAL HIGH (ref 70–99)
Glucose-Capillary: 185 mg/dL — ABNORMAL HIGH (ref 70–99)
Glucose-Capillary: 230 mg/dL — ABNORMAL HIGH (ref 70–99)
Glucose-Capillary: 182 mg/dL — ABNORMAL HIGH (ref 70–99)
Glucose-Capillary: 180 mg/dL — ABNORMAL HIGH (ref 70–99)
Glucose-Capillary: 198 mg/dL — ABNORMAL HIGH (ref 70–99)

## 2019-09-04 LAB — ECHOCARDIOGRAM COMPLETE
Height: 71 in
Weight: 2880 oz

## 2019-09-04 MED ORDER — ASPIRIN 325 MG PO TABS
325.0000 mg | ORAL_TABLET | Freq: Every day | ORAL | Status: DC
  Administered 2019-09-05 – 2019-09-07 (×3): 325 mg via ORAL
  Filled 2019-09-04 (×3): qty 1

## 2019-09-04 MED ORDER — ASPIRIN EC 81 MG PO TBEC
81.0000 mg | DELAYED_RELEASE_TABLET | Freq: Two times a day (BID) | ORAL | Status: AC
  Administered 2019-09-04: 81 mg via ORAL
  Filled 2019-09-04: qty 1

## 2019-09-04 NOTE — Consult Note (Signed)
Palliative Medicine   Name: Alexander Strickland Date: 09/04/2019 MRN: [id number]  DOB: [birth year]  Patient Care Team: Patient, No Pcp Per as PCP - General (General Practice)    REASON FOR CONSULTATION: Palliative Care consult requested for this 73 y.o. male with multiple medical problems including stage IV pancreatic cancer and insulin-dependent diabetes who is currently followed at home by hospice.  Patient was admitted to the hospital on 09/03/2019 with altered mental status after presenting with possible posturing and left-sided weakness.  Patient was found to be hypoglycemic.  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  Palliative care was consulted to help address goals.  SOCIAL HISTORY:     reports that he quit smoking about 19 years ago. He has never used smokeless tobacco. He reports that he does not drink alcohol or use drugs.   Patient is married and lives at home with his wife.  He was followed at home by [facility].  Patient has adult children.  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  ADVANCE DIRECTIVES:  Not on file  CODE STATUS: Full code  PAST MEDICAL HISTORY: Past Medical History:  Diagnosis Date  . Anemia   . Arthritis   . Family history of cancer   . Family history of cancer   . 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 GERD (gastroesophageal reflux disease)   . Hypertension     PAST SURGICAL HISTORY:  Past Surgical History:  Procedure Laterality Date  . JOINT REPLACEMENT     hip  . KNEE SURGERY     Bilateral TKRs  . SHOULDER SURGERY     Left Rotator Cuff Repair  . TOTAL HIP ARTHROPLASTY Right 05/02/2016   Procedure: RIGHT TOTAL HIP ARTHROPLASTY ANTERIOR APPROACH;  Surgeon: Rod Can, MD;  Location: [facility];  Service: Orthopedics;  Laterality: Right;    HEMATOLOGY/ONCOLOGY HISTORY:   Oncology History   No history exists.    ALLERGIES:  is allergic to pollen extract.  MEDICATIONS:  Current Facility-Administered Medications  Medication Dose Route Frequency Provider Last Rate Last Dose  . aspirin EC tablet 81 mg  81 mg Oral BID PC Merton Border, MD   81 mg at 09/04/19 1010  . cholecalciferol (VITAMIN D3) tablet 1,000 Units  1,000 Units Oral Daily Merton Border, MD   1,000 Units at 09/04/19 1010  . docusate sodium (COLACE) capsule 100 mg  100 mg Oral BID Merton Border, MD   100 mg at 09/04/19 1010  . enoxaparin (LOVENOX) injection 40 mg  40 mg Subcutaneous Q24H Merton Border, MD   40 mg at 09/03/19 2231  . HYDROmorphone (DILAUDID) injection 1 mg  1 mg Intravenous Q3H PRN Merton Border, MD   1 mg at 09/03/19 1623  . LORazepam (ATIVAN) injection 0.5 mg  0.5 mg Intravenous Q4H PRN Merton Border, MD   0.5 mg at 09/04/19 0016  . naphazoline-pheniramine (NAPHCON-A) 0.025-0.3 % ophthalmic solution 1 drop  1 drop Both Eyes QID PRN Merton Border, MD      . ondansetron (ZOFRAN) tablet 4 mg  4 mg Oral Q8H PRN Merton Border, MD      . oxyCODONE (Oxy IR/ROXICODONE) immediate release tablet 5 mg  5 mg Oral Q4H PRN Merton Border, MD   5 mg at 09/04/19 1010  . oxyCODONE-acetaminophen (PERCOCET/ROXICET) 5-325 MG per  tablet 1-2 tablet  1-2 tablet Oral Q4H PRN Merton Border, MD      . pantoprazole (PROTONIX) EC tablet 40 mg  40 mg Oral Daily Merton Border, MD   40 mg at 09/04/19 1011  . sodium chloride flush (NS) 0.9 % injection 3 mL  3 mL Intravenous Once Merton Border, MD        VITAL SIGNS: BP 138/81 (BP Location: Left Arm)   Pulse 88   Temp 99.7 F (37.6 C) (Axillary)   Resp (!) 24   Ht 5\' 11"  (1.803 m)   Wt 180 lb (81.6 kg)   SpO2 99%   BMI 25.10 kg/m  Filed Weights   09/03/19 1810  Weight: 180 lb (81.6 kg)    Estimated body mass index is 25.1 kg/m as calculated from the following:   Height as of this encounter: 5\' 11"  (1.803 m).   Weight as of this encounter: 180 lb (81.6 kg).  LABS: CBC:     Component Value Date/Time   WBC 18.0 (H) 09/03/2019 1225   HGB 7.3 (L) 09/03/2019 1225   HCT 22.7 (L) 09/03/2019 1225   PLT 105 (L) 09/03/2019 1225   MCV 86.6 09/03/2019 1225   NEUTROABS 15.1 (H) 09/03/2019 1225   LYMPHSABS 1.1 09/03/2019 1225   MONOABS 1.6 (H) 09/03/2019 1225   EOSABS 0.0 09/03/2019 1225   BASOSABS 0.0 09/03/2019 1225   Comprehensive Metabolic Panel:    Component Value Date/Time   NA 133 (L) 09/03/2019 1225   K 4.5 09/03/2019 1225   CL 106 09/03/2019 1225   CO2 20 (L) 09/03/2019 1225   BUN 21 09/03/2019 1225   CREATININE 1.08 09/03/2019 1225   GLUCOSE 97 09/03/2019 1225   CALCIUM 8.1 (L) 09/03/2019 1225   AST 87 (H) 09/03/2019 1225   ALT 48 (H) 09/03/2019 1225   ALKPHOS 440 (H) 09/03/2019 1225   BILITOT 2.1 (H) 09/03/2019 1225   PROT 5.8 (L) 09/03/2019 1225   ALBUMIN 1.9 (L) 09/03/2019 1225    RADIOGRAPHIC STUDIES: Ct Head Code Stroke Wo Contrast  Result Date: 09/03/2019 CLINICAL DATA:  Code stroke. Left-sided gaze and right-sided weakness EXAM: CT HEAD WITHOUT CONTRAST TECHNIQUE: Contiguous axial images were obtained from the base of the skull through the vertex without intravenous contrast. COMPARISON:  01/17/2018 FINDINGS: Brain: Moderate area of cortical and white matter low-density in the right occipital lobe. Contiguous superior and anterior low-density with surface mineralization involving a moderate area. No hematoma, hydrocephalus, or solid masslike finding. 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 Vascular: No hyperdense vessel. Skull: Negative Sinuses/Orbits: Negative Other: These results were called by telephone at the time of interpretation on 09/03/2019 at 10:03 am to provider Rory Percy, who verbally acknowledged these results. ASPECTS [facility] Stroke Program Early CT Score) Not scored in the setting of subacute infarct findings. IMPRESSION: Moderate infarct in the right occipital and low parietal lobes with extensive surface mineralization suggesting subacute  to remote timing. No definite acute infarct. No hematoma. Electronically Signed   By: Monte Fantasia M.D.   On: 09/03/2019 10:07    PERFORMANCE STATUS (ECOG) : 4 - Bedbound  Review of Systems Unable to complete  Physical Exam General: Ill-appearing Cardiovascular: regular rate and rhythm Pulmonary: clear ant fields Abdomen: soft, nontender, + bowel sounds GU: no suprapubic tenderness Extremities: no edema, no joint deformities Skin: no rashes Neurological: Poorly responsive  IMPRESSION: Patient is poorly responsive.  He has had some agitation and is currently requiring a sitter to be at bedside.  Patient is unable to participate in a conversation regarding goals.  I called and spoke with patient's wife.  She confirms that patient was followed at home by hospice prior to this hospitalization.  She knows that patient has had a stroke and that his cognitive and functional status may be irrevocably altered.  We discussed the possibility that he may be nearing end-of-life.  However, she seems optimistic regarding his chances for meaningful improvement.  She also remains committed to the current scope of treatment.  Wife would like patient to return home at time of discharge and to have hospice follow.  I attempted to clarify future decisions such as medical treatment, hospitalization, artificial nutrition, and CODE STATUS.  Wife says that she and her husband had discussed some of these issues with the help of hospice but had not made any decisions.  She is hopeful that patient may be able to participate in decision-making at some point in the future.  For now, she would like him to remain full code and full scope of treatment.  PLAN: -Continue current scope of treatment -Recommend DNR.  Wife is thinking about decision-making -Probable home with hospice when medically ready -Full code    Time Total: 60 minutes  Visit consisted of counseling and education dealing with the complex and  emotionally intense issues of symptom management and palliative care in the setting of serious and potentially life-threatening illness.Greater than 50%  of this time was spent counseling and coordinating care related to the above assessment and plan.  Signed by: Altha Harm, PhD, NP-C [phone] (Work Cell)

## 2019-09-04 NOTE — Evaluation (Addendum)
Clinical/Bedside Swallow Evaluation Patient Details  Name: Alexander Strickland MRN: [id number] Date of Birth: 10-Dec-1946  Today's Date: 09/04/2019 Time: SLP Start Time (ACUTE ONLY): 0815 SLP Stop Time (ACUTE ONLY): 0833 SLP Time Calculation (min) (ACUTE ONLY): 18 min  Past Medical History:  Past Medical History:  Diagnosis Date  . Anemia   . Arthritis   . Family history of cancer   . Family history of cancer   . 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 GERD (gastroesophageal reflux disease)   . Hypertension    Past Surgical History:  Past Surgical History:  Procedure Laterality Date  . JOINT REPLACEMENT     hip  . KNEE SURGERY     Bilateral TKRs  . SHOULDER SURGERY     Left Rotator Cuff Repair  . TOTAL HIP ARTHROPLASTY Right 05/02/2016   Procedure: RIGHT TOTAL HIP ARTHROPLASTY ANTERIOR APPROACH;  Surgeon: Rod Can, MD;  Location: [facility];  Service: Orthopedics;  Laterality: Right;   HPI:  Alexander Strickland  is a 73 y.o. male, with past medical history significant for metastatic pancreatic cancer, anemia and diabetes mellitus on insulin, presenting with an episode of altered mental status and seizure today early in a.m.  Patient is on Lantus at home and he was advised to decrease his dose yesterday to 9 units subcu nightly.  Today he started having some jerking of the right arm with possible posturing.  He also seemed weak on the left side compared to right and received Versed IV for jerking and left gaze preference sodium 133 potassium 4.5 alkaline phosphatase 440, white blood cell count 18 hemoglobin 7.3 platelets 105 INR 1.7.  Patient was also found to be hypoglycemic in the emergency room and he was started on D10 water.  CT of the head was showing moderate infarct in the right occipital and low parietal lobes with extensive surface mineralization suggesting subacute to remote timing. No definite acute infarct. No  hematoma.  MRI of the head was attempted and unable to be completed.     Assessment / Plan / Recommendation Clinical Impression  Clinical swallowing evaluation was completed using thin liquids via spoon, cup and straw, pureed material and dual textured solids.  RN reported that the patient has been confused and unable to follow any commands.  He has a 1:1 sitter due to combativeness.   Cranial nerve exam was attempted and lingual range of motion appeared to be adequate.  Other cranial nerve testing was unable to be completed due to his difficulty following commands but obvious issues were not noted.  He was noted to have an edentulous upper arch and only a few teeth on his lower arch.  He presented with an oral dysphagia characterized by delayed oral transit that was most likely impacted by his lack of dentition.  Oral residue was not seen post swallow.  Swallow trigger was appreciated to palpation and overt s/s of aspiration were not seen even given serial sips of thin liquids via straw sips.  The patient's biggest risk factor for aspiration is most likely his altered mentation.  Recommend begin a dysphagia 3 diet with thin liquids.   ST will follow up for therapeutic diet tolerance.  Consider cognitive/linguistic evaluation given patient's current level of confusion and concern for new CVA.  MD please order if you agree.   SLP Visit Diagnosis: Dysphagia, oral phase (R13.11)    Aspiration Risk  Mild aspiration risk  Diet Recommendation   Dysphagia 3 with thin liquids  Medication Administration: Whole meds with liquid    Other  Recommendations Oral Care Recommendations: Oral care BID   Follow up Recommendations Other (comment)(TBD)      Frequency and Duration min 2x/week  2 weeks       Prognosis Prognosis for Safe Diet Advancement: Fair Barriers to Reach Goals: Cognitive deficits      Swallow Study   General Date of Onset: 09/03/19 HPI: Alexander Strickland  is a 73 y.o. male, with past  medical history significant for metastatic pancreatic cancer, anemia and diabetes mellitus on insulin, presenting with an episode of altered mental status and seizure today early in a.m.  Patient is on Lantus at home and he was advised to decrease his dose yesterday to 9 units subcu nightly.  Today he started having some jerking of the right arm with possible posturing.  He also seemed weak on the left side compared to right and received Versed IV for jerking and left gaze preference sodium 133 potassium 4.5 alkaline phosphatase 440, white blood cell count 18 hemoglobin 7.3 platelets 105 INR 1.7.  Patient was also found to be hypoglycemic in the emergency room and he was started on D10 water.  CT of the head was showing moderate infarct in the right occipital and low parietal lobes with extensive surface mineralization suggesting subacute to remote timing. No definite acute infarct. No hematoma.  MRI of the head was attempted and unable to be completed.   Type of Study: Bedside Swallow Evaluation Previous Swallow Assessment: None noted at [facility]. Diet Prior to this Study: NPO Temperature Spikes Noted: No History of Recent Intubation: No Behavior/Cognition: Alert;Confused;Doesn't follow directions;Requires cueing Oral Cavity Assessment: Within Functional Limits Oral Care Completed by SLP: Recent completion by staff Oral Cavity - Dentition: Poor condition;Missing dentition Self-Feeding Abilities: Total assist Patient Positioning: Upright in bed Baseline Vocal Quality: Low vocal intensity Volitional Cough: Cognitively unable to elicit Volitional Swallow: Unable to elicit    Oral/Motor/Sensory Function Overall Oral Motor/Sensory Function: Other (comment)(unable to assess)   Ice Chips Ice chips: Not tested   Thin Liquid Thin Liquid: Within functional limits Presentation: Cup;Spoon;Straw    Nectar Thick Nectar Thick Liquid: Not tested   Honey Thick Honey Thick Liquid: Not tested   Puree Puree: Within  functional limits Presentation: Spoon   Solid     Solid: Impaired Presentation: Spoon Oral Phase Impairments: Impaired mastication Oral Phase Functional Implications: Prolonged oral transit     Shelly Flatten, MA, CCC-SLP Acute Rehab SLP [phone]  Lamar Sprinkles 09/04/2019,8:40 AM

## 2019-09-04 NOTE — Progress Notes (Addendum)
[facility] 3W16 [facility]) [place] [facility] Admission  Mr. Landstrom was transported by EMS to [facility] ED following seizure like activity and altered level of consciousness.  CBG 22 en route to ED.  Code Stroke initiated.  CT revealed parietal infarcts.  Mr. Endicott is under hospice services with a terminal diagnosis of pancreatic cancer per Dr. Tomasa Hosteller.  He is admitted with seizures, hypoglycemia and altered level of consciousness.  This is a related hospital admission.  Mr. Regnier is alert but disoriented, unable to follow commands and intermittently combative.  Is not having any pain that is not controlled by interventions in the hospital.  Spoke with wife, she was feeling optimistic that Octavia Bruckner would get better, she is "putting it in [employer].    V/S:  99.6 oral, 126/81, HR 93, RR 26, SPO2 97% RA I&O:  591/no output recorded Abnormal lab work:  Na 133, Alk phos 440, AST 87, ALT 48, WBC 18, RBC 2.62, hgb 7.3, HCT 22.1, Plt 105, PT 19.4, INR 1.7 Diagnostics:  CT WO-Moderate infarct in the right occipital and low parietal lobes with extensive surface mineralization suggesting subacute to remote timing; EEG-cortical dysfunction in right occipital region consistent with underlying infarct IVs/PRNs- D10 @ 75 ml/hr; NS 1L bolus x 1; dilaudid 23m iv x 1; ativan 0.5 mg IV x 2; oxy IR 570mPO x 1  Problem List: Seizures- versed en route by EMS, no further seizure activity, EEG consistent with infarct Hypoglycemia- D10 infusion  IDT:  Updated Family:  Updated Goals of Care:  Unclear, full code D/C planning:  Ongoing, tentatively to return home with wife and continue hospice support  Transfer summary and med list sent to unit to be placed on shadow chart.  Thank you, JeVenia CarbonN, BSN, CCNes[place] Hospitaliaison (in [phone]

## 2019-09-04 NOTE — Progress Notes (Signed)
PROGRESS NOTE    Alexander Strickland  [id number] DOB: Nov 26, 1946 DOA: 09/03/2019 PCP: Patient, No Pcp Per  Brief Narrative:   73 year old gentleman with prior history of metastatic pancreatic cancer  ( follows up with the [facility] oncology) on home hospice, anemia, diabetes mellitus insulin-dependent brought into ED for altered mental status and seizures.  Patient was found to be hypoglycemic on arrival.  Work-up in the ED showed right-sided occipital infarct and low parietal lobe infarcts possibly subacute.  He was admitted for further evaluation of stroke and hypoglycemia.  Hospice was contacted and palliative care consulted.  EEG was done on 09/03/2019 showed slow generalized activity maximal in the right occipital area.  And evidence of moderate diffuse encephalopathy but without any epileptiform discharges. Neurology was consulted and an MRI of the brain ordered for further evaluation. As patient is on hospice, he is full code palliative care consulted for goals of care.  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 Neurology on board. Patient is on aspirin twice daily we will change it to 325 mg daily. Lipid panel ordered and hemoglobin A1c is pending. Echocardiogram ordered.  And therapy evaluations are pending.   Metastatic pancreatic cancer Patient is on home hospice and currently not getting any treatment. Appreciate palliative care consult in the goals of care discussion.  We will continue with wife's wishes and and current scope of treatment. Continue with pain control    Diabetes mellitus CBG (last 3)  Recent Labs    09/04/19 0905 09/04/19 1125 09/04/19 1400  GLUCAP 167* 185* 230*   Stop the IV dextrose. Get a1C. Continue with 2 hour cbgs for another 24 hours.    Patient passed swallow eval  and continue his diet.    Leukocytosis Unclear etiology. Patient is not coughing afebrile. Urine analysis will be obtained. COVID-19 screen is negative.    Anemia chronic disease and malignancy and thrombocytopenia Probably secondary to malignancy. Transfuse to keep hemoglobin greater than 7.   Elevated liver enzymes Probably secondary to metastatic pancreatic cancer.   Mild hyponatremia Probably secondary to dehydration and poor oral intake.   DVT prophylaxis: Lovenox Code Status: Full code Family Communication: Family at bedside Disposition Plan: Pending further evaluation and clinical improvement possibly back home with home hospice after stroke work-up is done.   Consultants:   Neurology  Palliative care  Procedures: MRI of brain Antimicrobials: None  Subjective: Patient is arousable but not following commands.  Able to move his upper extremities and  Left lower extremity spontaneously  Objective: Vitals:   09/04/19 0007 09/04/19 0357 09/04/19 0740 09/04/19 1121  BP: 129/90 (!) 150/94 126/81 138/81  Pulse: (!) 110 (!) 108 93 88  Resp: 18 20 (!) 26 (!) 24  Temp: 99 F (37.2 C)  99.6 F (37.6 C) 99.7 F (37.6 C)  TempSrc: Oral  Axillary Axillary  SpO2: 98% 93% 97% 99%  Weight:      Height:        Intake/Output Summary (Last 24 hours) at 09/04/2019 1405 Last data filed at 09/04/2019 1351 Gross per 24 hour  Intake 1311.34 ml  Output 150 ml  Net 1161.34 ml   Filed Weights   09/03/19 1810  Weight: 81.6 kg    Examination:  General exam: Appears calm and comfortable, not in any kind of distress Respiratory system:  Clear to auscultation. Respiratory effort normal. Cardiovascular system: S1 & S2 heard, RRR.Marland Kitchen No pedal edema. Gastrointestinal system: Abdomen is nondistended, soft and nontender. No organomegaly or masses felt. Normal bowel sounds heard. Central nervous system: Opening eyes on calling his name, able to move both upper extremities and  able to move his left lower extremity.  Facial droop present, not following commands. Right  upper extremity weakness present.  Patient is not moving right lower extremity.,  Babinski's could not be elicited on the right side.  Gait could not be tested. Speech could not be tested. Extremities: No pedal edema, cyanosis or clubbing Skin: No rashes, lesions or ulcers Psychiatry: Currently his mood is appropriate not agitated sitter at bedside    Data Reviewed: I have personally reviewed following labs and imaging studies  CBC: Recent Labs  Lab 09/03/19 1225  WBC 18.0*  NEUTROABS 15.1*  HGB 7.3*  HCT 22.7*  MCV 86.6  PLT 123456*   Basic Metabolic Panel: Recent Labs  Lab 09/03/19 1225  NA 133*  K 4.5  CL 106  CO2 20*  GLUCOSE 97  BUN 21  CREATININE 1.08  CALCIUM 8.1*   GFR: Estimated Creatinine Clearance: 62.9 mL/min (by C-G formula based on SCr of 1.08 mg/dL). Liver Function Tests: Recent Labs  Lab 09/03/19 1225  AST 87*  ALT 48*  ALKPHOS 440*  BILITOT 2.1*  PROT 5.8*  ALBUMIN 1.9*   No results for input(s): LIPASE, AMYLASE in the last 168 hours. No results for input(s): AMMONIA in the last 168 hours. Coagulation Profile: Recent Labs  Lab 09/03/19 1225  INR 1.7*   Cardiac Enzymes: No results for input(s): CKTOTAL, CKMB, CKMBINDEX, TROPONINI in the last 168 hours. BNP (last 3 results) No results for input(s): PROBNP in the last 8760 hours. HbA1C: No results for input(s): HGBA1C in the last 72 hours. CBG: Recent Labs  Lab 09/04/19 0556 09/04/19 0743 09/04/19 0905 09/04/19 1125 09/04/19 1400  GLUCAP 134* 124* 167* 185* 230*   Lipid Profile: No results for input(s): CHOL, HDL, LDLCALC, TRIG, CHOLHDL, LDLDIRECT in the last 72 hours. Thyroid Function Tests: No results for input(s): TSH, T4TOTAL, FREET4, T3FREE, THYROIDAB in the last 72 hours. Anemia Panel: No results for input(s): VITAMINB12, FOLATE, FERRITIN, TIBC, IRON, RETICCTPCT in the last 72  hours. Sepsis Labs: No results for input(s): PROCALCITON, LATICACIDVEN in the last 168 hours.  Recent Results (from the past 240 hour(s))  SARS Coronavirus 2 [facility] order, Performed in [facility] hospital lab) Nasopharyngeal Nasopharyngeal Swab     Status: None   Collection Time: 09/03/19 11:57 AM   Specimen: Nasopharyngeal Swab  Result Value Ref Range Status   SARS Coronavirus 2 NEGATIVE NEGATIVE Final    Comment: (NOTE) If result is NEGATIVE SARS-CoV-2 target nucleic acids are NOT DETECTED. The SARS-CoV-2 RNA is generally detectable in upper and lower  respiratory specimens during the acute phase of infection. The lowest  concentration of SARS-CoV-2 viral copies this assay can detect is 250  copies / mL. A negative result does not preclude SARS-CoV-2 infection  and should not be used as the sole basis for treatment or other  patient management decisions.  A negative result may occur with  improper specimen collection / handling, submission of specimen other  than nasopharyngeal swab, presence of viral mutation(s) within the  areas targeted by this assay, and inadequate number of viral copies  (<250 copies / mL). A negative result must be combined with clinical  observations, patient history, and epidemiological information.  If result is POSITIVE SARS-CoV-2 target nucleic acids are DETECTED. The SARS-CoV-2 RNA is generally detectable in upper and lower  respiratory specimens dur ing the acute phase of infection.  Positive  results are indicative of active infection with SARS-CoV-2.  Clinical  correlation with patient history and other diagnostic information is  necessary to determine patient infection status.  Positive results do  not rule out bacterial infection or co-infection with other viruses. If result is PRESUMPTIVE POSTIVE SARS-CoV-2 nucleic acids MAY BE PRESENT.   A presumptive positive result was obtained on the submitted specimen  and confirmed on repeat testing.   While 2019 novel coronavirus  (SARS-CoV-2) nucleic acids may be present in the submitted sample  additional confirmatory testing may be necessary for epidemiological  and / or clinical management purposes  to differentiate between  SARS-CoV-2 and other Sarbecovirus currently known to infect humans.  If clinically indicated additional testing with an alternate test  methodology [phone]) is advised. The SARS-CoV-2 RNA is generally  detectable in upper and lower respiratory sp ecimens during the acute  phase of infection. The expected result is Negative. Fact Sheet for Patients:  StrictlyIdeas.no Fact Sheet for Healthcare Providers: BankingDealers.co.za This test is not yet approved or cleared by the [place] FDA and has been authorized for detection and/or diagnosis of SARS-CoV-2 by FDA under an Emergency Use Authorization (EUA).  This EUA will remain in effect (meaning this test can be used) for the duration of the COVID-19 declaration under Section 564(b)(1) of the Act, 21 U.S.C. section 360bbb-3(b)(1), unless the authorization is terminated or revoked sooner. Performed at [facility], [place] [address], [place], [place] [address]          Radiology Studies: Ct Head Code Stroke Wo Contrast  Result Date: 09/03/2019 CLINICAL DATA:  Code stroke. Left-sided gaze and right-sided weakness EXAM: CT HEAD WITHOUT CONTRAST TECHNIQUE: Contiguous axial images were obtained from the base of the skull through the vertex without intravenous contrast. COMPARISON:  01/17/2018 FINDINGS: Brain: Moderate area of cortical and white matter low-density in the right occipital lobe. Contiguous superior and anterior low-density with surface mineralization involving a moderate area. No hematoma, hydrocephalus, or solid masslike finding. 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 Vascular: No hyperdense vessel. Skull: Negative Sinuses/Orbits: Negative Other: These  results were called by telephone at the time of interpretation on 09/03/2019 at 10:03 am to provider Rory Percy, who verbally acknowledged these results. ASPECTS [facility] Stroke Program Early CT Score) Not scored in the setting of subacute infarct findings. 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 No definite acute infarct. No hematoma. Electronically Signed   By: Monte Fantasia M.D.   On: 09/03/2019 10:07        Scheduled Meds: . aspirin EC  81 mg Oral BID PC  . cholecalciferol  1,000 Units Oral Daily  . docusate sodium  100 mg Oral BID  . enoxaparin (LOVENOX) injection  40 mg Subcutaneous Q24H  . pantoprazole  40 mg Oral Daily  . sodium chloride flush  3 mL Intravenous Once   Continuous Infusions:   LOS: 1 day    Time spent: 42 minutes.     Hosie Poisson, MD [facility] Pager [phone]  If 7PM-7AM, please contact night-coverage [email or web address] Password [facility] 09/04/2019, 2:05 PM

## 2019-09-04 NOTE — Progress Notes (Addendum)
  Echocardiogram 2D Echocardiogram has been performed.  Extremely difficult study due to poor patient compliance. Patient continued to roll onto techs hand, making exam difficult to perform.   Alexander Strickland L Androw 09/04/2019, 3:54 PM

## 2019-09-05 ENCOUNTER — Inpatient Hospital Stay (HOSPITAL_COMMUNITY)

## 2019-09-05 DIAGNOSIS — G9341 Metabolic encephalopathy: Secondary | ICD-10-CM

## 2019-09-05 LAB — GLUCOSE, CAPILLARY
Glucose-Capillary: 162 mg/dL — ABNORMAL HIGH (ref 70–99)
Glucose-Capillary: 173 mg/dL — ABNORMAL HIGH (ref 70–99)
Glucose-Capillary: 188 mg/dL — ABNORMAL HIGH (ref 70–99)
Glucose-Capillary: 195 mg/dL — ABNORMAL HIGH (ref 70–99)
Glucose-Capillary: 209 mg/dL — ABNORMAL HIGH (ref 70–99)
Glucose-Capillary: 221 mg/dL — ABNORMAL HIGH (ref 70–99)
Glucose-Capillary: 249 mg/dL — ABNORMAL HIGH (ref 70–99)

## 2019-09-05 LAB — CBC
HCT: 23.3 % — ABNORMAL LOW (ref 39.0–52.0)
Hemoglobin: 7.6 g/dL — ABNORMAL LOW (ref 13.0–17.0)
MCH: 27.6 pg (ref 26.0–34.0)
MCHC: 32.6 g/dL (ref 30.0–36.0)
MCV: 84.7 fL (ref 80.0–100.0)
Platelets: 117 10*3/uL — ABNORMAL LOW (ref 150–400)
RBC: 2.75 MIL/uL — ABNORMAL LOW (ref 4.22–5.81)
RDW: 18.6 % — ABNORMAL HIGH (ref 11.5–15.5)
WBC: 15.7 10*3/uL — ABNORMAL HIGH (ref 4.0–10.5)
nRBC: 0 % (ref 0.0–0.2)

## 2019-09-05 LAB — BASIC METABOLIC PANEL
Anion gap: 7 (ref 5–15)
BUN: 17 mg/dL (ref 8–23)
CO2: 20 mmol/L — ABNORMAL LOW (ref 22–32)
Calcium: 7.9 mg/dL — ABNORMAL LOW (ref 8.9–10.3)
Chloride: 105 mmol/L (ref 98–111)
Creatinine, Ser: 0.92 mg/dL (ref 0.61–1.24)
GFR calc Af Amer: >60 mL/min (ref >60)
GFR calc non Af Amer: >60 mL/min (ref >60)
Glucose, Bld: 183 mg/dL — ABNORMAL HIGH (ref 70–99)
Potassium: 4 mmol/L (ref 3.5–5.1)
Sodium: 132 mmol/L — ABNORMAL LOW (ref 135–145)

## 2019-09-05 LAB — LIPID PANEL
Cholesterol: 128 mg/dL (ref 0–200)
HDL: 11 mg/dL — ABNORMAL LOW (ref >40)
LDL Cholesterol: 103 mg/dL — ABNORMAL HIGH (ref 0–99)
Total CHOL/HDL Ratio: 11.6 RATIO
Triglycerides: 70 mg/dL (ref <150)
VLDL: 14 mg/dL (ref 0–40)

## 2019-09-05 LAB — HEMOGLOBIN A1C
Hgb A1c MFr Bld: 4.5 % — ABNORMAL LOW (ref 4.8–5.6)
Mean Plasma Glucose: 82 mg/dL

## 2019-09-05 MED ORDER — ATORVASTATIN CALCIUM 10 MG PO TABS
10.0000 mg | ORAL_TABLET | Freq: Every day | ORAL | Status: DC
  Administered 2019-09-05 – 2019-09-07 (×3): 10 mg via ORAL
  Filled 2019-09-05 (×3): qty 1

## 2019-09-05 MED ORDER — HYDROMORPHONE HCL 1 MG/ML IJ SOLN: 1.0000 mg | INTRAMUSCULAR | Status: DC | PRN

## 2019-09-05 MED ORDER — MORPHINE SULFATE ER 15 MG PO TBCR
30.0000 mg | EXTENDED_RELEASE_TABLET | Freq: Two times a day (BID) | ORAL | Status: DC
  Administered 2019-09-05 – 2019-09-07 (×4): 30 mg via ORAL
  Filled 2019-09-05 (×4): qty 2

## 2019-09-05 MED ORDER — SODIUM CHLORIDE 0.9 % IV SOLN
INTRAVENOUS | Status: DC
  Administered 2019-09-05 – 2019-09-07 (×3): via INTRAVENOUS

## 2019-09-05 NOTE — Evaluation (Signed)
Occupational Therapy Evaluation Patient Details Name: Alexander Strickland MRN: [id number] DOB: 05/27/1946 Today's Date: 09/05/2019    History of Present Illness 73yo male presenting with AMS and seizures (witnessed in ED), hypoglycemic in ED. Also found to have subacte parietal and occipital lobe infarcts, no tpa given. PMH metastatic pancreatic CA, DM, HTN, R THR, B TKRs, L RCR   Clinical Impression   Pt admitted with above diagnoses, with LUE weakness, decreased activity tolerance, and declined cognition limiting ability to engage in BADL at desired level of ind. Wife present to assist in collateral info. PTA pt walking short distances in the home for toilet t/f, etc. [place] RN coming out once a week prior to admission. At time of eval, he is overall a min guard for bed mobility and min-mod A for sit <> stand transfers. Pt attempted to don socks, needing max A. Pt with the cognitive deficits described below. At this time, recommending HHOT with HHA support at d/c to increase BADL ind and decrease caregiver burden (if hospice agrees with POC).    Follow Up Recommendations  Home health OT;Supervision/Assistance - 24 hour    Equipment Recommendations  Hospital bed;3 in 1 bedside commode    Recommendations for Other Services       Precautions / Restrictions Precautions Precautions: Fall;Other (comment) Precaution Comments: weakness L UE Restrictions Weight Bearing Restrictions: No      Mobility Bed Mobility Overal bed mobility: Needs Assistance Bed Mobility: Supine to Sit;Sit to Supine     Supine to sit: Min guard Sit to supine: Min guard   General bed mobility comments: min guard for safety and line management, extended time, no physical assist given  Transfers Overall transfer level: Needs assistance Equipment used: 2 person hand held assist Transfers: Sit to/from Stand Sit to Stand: Mod assist;Min assist         General transfer comment: mod A +2 for first stand with  multimodal cueing for upright posture; improved to min A +2 with second stand    Balance Overall balance assessment: Needs assistance Sitting-balance support: Bilateral upper extremity supported;Feet supported Sitting balance-Leahy Scale: Fair Sitting balance - Comments: tends to lean anteriorly   Standing balance support: Bilateral upper extremity supported;During functional activity Standing balance-Leahy Scale: Poor Standing balance comment: reliant on external support                           ADL either performed or assessed with clinical judgement   ADL Overall ADL's : Needs assistance/impaired Eating/Feeding: Minimal assistance;Sitting Eating/Feeding Details (indicate cue type and reason): pt wife reporting having to help feed self this AM Grooming: Minimal assistance;Sitting   Upper Body Bathing: Minimal assistance;Sitting   Lower Body Bathing: Maximal assistance;Sit to/from stand;Sitting/lateral leans   Upper Body Dressing : Minimal assistance;Sitting   Lower Body Dressing: Maximal assistance;Sit to/from stand;Sitting/lateral leans   Toilet Transfer: Moderate assistance;Stand-pivot;BSC;RW   Toileting- Clothing Manipulation and Hygiene: Minimal assistance;Moderate assistance;Sitting/lateral lean;Sit to/from stand   Tub/ Banker: Moderate assistance;Shower seat;Ambulation;Rolling walker;3 in 1   Functional mobility during ADLs: Moderate assistance;Rolling walker General ADL Comments: pt limited 2/2 LUE weakness, decreased activity tolerance and decline in cognition     Vision Baseline Vision/History: Wears glasses Wears Glasses: At all times Patient Visual Report: No change from baseline Additional Comments: reports no deficits, difficult to assess given cog     Perception     Praxis      Pertinent Vitals/Pain Pain Assessment: Faces Pain Score:  0-No pain Faces Pain Scale: No hurt Pain Intervention(s): Monitored during session     Hand  Dominance     Extremity/Trunk Assessment Upper Extremity Assessment Upper Extremity Assessment: Generalized weakness;LUE deficits/detail LUE Deficits / Details: greater weakness LUE Coordination: decreased fine motor;decreased gross motor   Lower Extremity Assessment Lower Extremity Assessment: Defer to PT evaluation   Cervical / Trunk Assessment Cervical / Trunk Assessment: Normal   Communication Communication Communication: No difficulties   Cognition Arousal/Alertness: Awake/alert Behavior During Therapy: WFL for tasks assessed/performed Overall Cognitive Status: Impaired/Different from baseline Area of Impairment: Orientation;Attention;Memory;Following commands;Safety/judgement;Awareness;Problem solving                 Orientation Level: Time;Situation;Disoriented to Current Attention Level: Sustained Memory: Decreased short-term memory;Decreased recall of precautions Following Commands: Follows one step commands consistently;Follows one step commands with increased time Safety/Judgement: Decreased awareness of deficits;Decreased awareness of safety Awareness: Intellectual Problem Solving: Slow processing;Decreased initiation;Difficulty sequencing;Requires verbal cues;Requires tactile cues     General Comments       Exercises     Shoulder Instructions      Home Living Family/patient expects to be discharged to:: Private residence Living Arrangements: Spouse/significant other Available Help at Discharge: Family;Available 24 hours/day Type of Home: House Home Access: Level entry     Home Layout: One level     Bathroom Shower/Tub: Occupational psychologist: Standard     Home Equipment: Environmental consultant - 2 wheels;Cane - single point   Additional Comments: wife reports they have a nurse that comes once a week      Prior Functioning/Environment Level of Independence: Independent        Comments: per wife, he could get up to bathroom and walk short  distances in the home independently        OT Problem List: Decreased strength;Decreased knowledge of use of DME or AE;Decreased coordination;Decreased activity tolerance;Decreased cognition;Impaired UE functional use;Impaired balance (sitting and/or standing);Decreased safety awareness      OT Treatment/Interventions: Self-care/ADL training;Therapeutic exercise;Patient/family education;Neuromuscular education;Balance training;Energy conservation;Therapeutic activities;DME and/or AE instruction;Cognitive remediation/compensation    OT Goals(Current goals can be found in the care plan section) Acute Rehab OT Goals [name] OT Goal Formulation: With patient Time For Goal Achievement: 09/19/19 Potential to Achieve Goals: Good  OT Frequency: Min 2X/week   Barriers to D/C:            Co-evaluation PT/OT/SLP Co-Evaluation/Treatment: Yes Reason for Co-Treatment: Complexity of the patient's impairments (multi-system involvement);Necessary to address cognition/behavior during functional activity;For patient/therapist safety;To address functional/ADL transfers PT goals addressed during session: Mobility/safety with mobility;Balance OT goals addressed during session: ADL's and self-care      AM-PAC OT "6 Clicks" Daily Activity     Outcome Measure Help from another person eating meals?: A Little Help from another person taking care of personal grooming?: A Little Help from another person toileting, which includes using toliet, bedpan, or urinal?: A Lot Help from another person bathing (including washing, rinsing, drying)?: A Lot Help from another person to put on and taking off regular upper body clothing?: A Little Help from another person to put on and taking off regular lower body clothing?: A Lot 6 Click Score: 15   End of Session Equipment Utilized During Treatment: Gait belt;Rolling walker Nurse Communication: Mobility status  Activity Tolerance: Patient  tolerated treatment well Patient left: in bed;with call bell/phone within reach;with bed alarm set;with family/visitor present  OT Visit Diagnosis: Other abnormalities of gait and mobility (R26.89);Muscle weakness (  generalized) (M62.81);Other symptoms and signs involving cognitive function                Time: [id number] OT Time Calculation (min): 25 min Charges:  OT General Charges $OT Visit: 1 Visit OT Evaluation $OT Eval Moderate Complexity: 1 Mod  Zenovia Jarred, MSOT, OTR/L Behavioral Health OT/ Acute Relief OT [facility] Office: [phone]  Zenovia Jarred 09/05/2019, 1:15 PM

## 2019-09-05 NOTE — Plan of Care (Signed)
  Problem: Activity: Goal: Risk for activity intolerance will decrease Outcome: Progressing   

## 2019-09-05 NOTE — Progress Notes (Signed)
PROGRESS NOTE    Alexander Strickland  [id number] DOB: Dec 14, 1946 DOA: 09/03/2019 PCP: Patient, No Pcp Per  Brief Narrative:   73 year old gentleman with prior history of metastatic pancreatic cancer  ( follows up with the [facility]) on home hospice, anemia, diabetes mellitus insulin-dependent brought into ED for altered mental status and seizures.  Patient was found to be hypoglycemic on arrival.  Work-up in the ED showed right-sided occipital infarct and low parietal lobe infarcts possibly subacute.  He was admitted for further evaluation of stroke and hypoglycemia.  Hospice was contacted and palliative care consulted.  EEG was done on 09/03/2019 showed slow generalized activity maximal in the right occipital area.  And evidence of moderate diffuse encephalopathy but without any epileptiform discharges. Neurology was consulted and an MRI of the brain ordered for further evaluation.  patient is on hospice and he is full code,  palliative care consulted for goals of care. Patient seen and examined today, patient continues to be lethargic able to open his eyes calling his name follows minimal commands.  Wife at bedside and discussed the plan with the patient's wife.  Assessment & Plan:   Active Problems:   Hypoglycemia   Altered mental status   Palliative care encounter  Acute encephalopathy probably secondary to the stroke Moderate infarct in the right occipital and low parietal lobes with extensive surface mineralization suggesting subacute infarct  MRI of the brain ordered for further evaluation. Neurology on board.  And appreciate their recommendations. Patient is on aspirin twice daily we will change it to 325 mg daily. LDL is 103, HDL is 11 and hemoglobin A1c is pending. Echocardiogram ordered showed normal systolic function with an ejection fraction of 60 to 65%, left ventricular diastolic parameters are consistent with impaired relaxation, no evidence of left ventricular regional wall  motion abnormalities.  No intracardiac thrombi or masses were visualized.Marland Kitchen   SLP evaluation recommended dysphagia 3 diet with thin liquids  PT and OT evaluations are pending.  Metastatic pancreatic cancer Patient is on home hospice and currently not getting any treatment. Appreciate palliative care consult in the goals of care discussion.  We will continue with wife's wishes and and current scope of treatment. Patient morning with abdominal pain this morning and increased his Dilaudid from 1 to 2 mg every 3 hours as needed in addition to his home pain regimen.    Diabetes mellitus Better controlled CBGs. CBG (last 3)  Recent Labs    09/05/19 0359 09/05/19 0659 09/05/19 0846  GLUCAP 188* 162* 195*   Stop the IV dextrose. Get a1C.  Patient is on dysphagia 3 diet with thin liquids no more episodes of hypoglycemia Patient passed swallow eval and continue his diet.    Leukocytosis Unclear etiology. Patient is not coughing afebrile. Urine analysis will be obtained. COVID-19 screen is negative. Repeat labs this morning are pending.    Anemia chronic disease and malignancy and thrombocytopenia Probably secondary to malignancy. Transfuse to keep hemoglobin greater than 7.  Repeat CBC this morning   Elevated liver enzymes Probably secondary to metastatic pancreatic cancer.   Mild hyponatremia Probably secondary to dehydration and poor oral intake.  Ordered normal saline at 75 mL/h and repeat BMP today   DVT prophylaxis: Lovenox Code Status: Full code Family Communication: Family at bedside Disposition Plan: Pending further evaluation and clinical improvement possibly back home with home hospice after stroke work-up is done.   Consultants:   Neurology  Palliative care  Procedures: MRI of brain Antimicrobials: None  Subjective: Patient seen and examined today, patient lethargic but moaning in pain, but able to open his eyes calling his name and follows minimal  commands.  Wife at bedside and discussed the plan with the patient's wife.   Objective: Vitals:   09/04/19 1954 09/04/19 2325 09/05/19 0356 09/05/19 0847  BP: (!) 147/94 122/75 133/84 124/83  Pulse: 100 98 91 93  Resp: 20 18 18 18   Temp: 98.7 F (37.1 C) 98.7 F (37.1 C) 99.3 F (37.4 C) 98.4 F (36.9 C)  TempSrc: Oral Oral Oral Oral  SpO2: 100% 99% 100% 100%  Weight:      Height:        Intake/Output Summary (Last 24 hours) at 09/05/2019 1025 Last data filed at 09/05/2019 0540 Gross per 24 hour  Intake 720.22 ml  Output 500 ml  Net 220.22 ml   Filed Weights   09/03/19 1810  Weight: 81.6 kg    Examination:  General exam: Mild distress from abdominal pain Respiratory system: Air entry fair bilaterally no wheezing or rhonchi Cardiovascular system: S1-S2 heard, regular rate rhythm, no pedal edema Gastrointestinal system: Abdomen is soft, has generalized tenderness, bowel sounds are okay Central nervous system: Patient slightly lethargic but is able to open his eyes on calling his name moves all his extremities spontaneously.  Following simple commands.   Gait could not be tested. Speech could not be tested. Extremities: No pedal edema. Skin: No rashes seen Psychiatry: Appears to be in mild distress from abdominal pain, restless but not agitated.    Data Reviewed: I have personally reviewed following labs and imaging studies  CBC: Recent Labs  Lab 09/03/19 1225  WBC 18.0*  NEUTROABS 15.1*  HGB 7.3*  HCT 22.7*  MCV 86.6  PLT 123456*   Basic Metabolic Panel: Recent Labs  Lab 09/03/19 1225  NA 133*  K 4.5  CL 106  CO2 20*  GLUCOSE 97  BUN 21  CREATININE 1.08  CALCIUM 8.1*   GFR: Estimated Creatinine Clearance: 62.9 mL/min (by C-G formula based on SCr of 1.08 mg/dL). Liver Function Tests: Recent Labs  Lab 09/03/19 1225  AST 87*  ALT 48*  ALKPHOS 440*  BILITOT 2.1*  PROT 5.8*  ALBUMIN 1.9*   No results for input(s): LIPASE, AMYLASE in the last  168 hours. No results for input(s): AMMONIA in the last 168 hours. Coagulation Profile: Recent Labs  Lab 09/03/19 1225  INR 1.7*   Cardiac Enzymes: No results for input(s): CKTOTAL, CKMB, CKMBINDEX, TROPONINI in the last 168 hours. BNP (last 3 results) No results for input(s): PROBNP in the last 8760 hours. HbA1C: No results for input(s): HGBA1C in the last 72 hours. CBG: Recent Labs  Lab 09/04/19 2017 09/05/19 0050 09/05/19 0359 09/05/19 0659 09/05/19 0846  GLUCAP 198* 249* 188* 162* 195*   Lipid Profile: Recent Labs    09/05/19 0609  CHOL 128  HDL 11*  LDLCALC 103*  TRIG 70  CHOLHDL 11.6   Thyroid Function Tests: No results for input(s): TSH, T4TOTAL, FREET4, T3FREE, THYROIDAB in the last 72 hours. Anemia Panel: No results for input(s): VITAMINB12, FOLATE, FERRITIN, TIBC, IRON, RETICCTPCT in the last 72 hours. Sepsis Labs: No results for input(s): PROCALCITON, LATICACIDVEN in the last 168 hours.  Recent Results (from the past 240 hour(s))  SARS Coronavirus 2 [facility] order, Performed in [facility] hospital lab) Nasopharyngeal Nasopharyngeal Swab     Status: None   Collection Time: 09/03/19 11:57 AM   Specimen: Nasopharyngeal Swab  Result Value Ref  Range Status   SARS Coronavirus 2 NEGATIVE NEGATIVE Final    Comment: (NOTE) If result is NEGATIVE SARS-CoV-2 target nucleic acids are NOT DETECTED. The SARS-CoV-2 RNA is generally detectable in upper and lower  respiratory specimens during the acute phase of infection. The lowest  concentration of SARS-CoV-2 viral copies this assay can detect is 250  copies / mL. A negative result does not preclude SARS-CoV-2 infection  and should not be used as the sole basis for treatment or other  patient management decisions.  A negative result may occur with  improper specimen collection / handling, submission of specimen other  than nasopharyngeal swab, presence of viral mutation(s) within the  areas targeted by this  assay, and inadequate number of viral copies  (<250 copies / mL). A negative result must be combined with clinical  observations, patient history, and epidemiological information. If result is POSITIVE SARS-CoV-2 target nucleic acids are DETECTED. The SARS-CoV-2 RNA is generally detectable in upper and lower  respiratory specimens dur ing the acute phase of infection.  Positive  results are indicative of active infection with SARS-CoV-2.  Clinical  correlation with patient history and other diagnostic information is  necessary to determine patient infection status.  Positive results do  not rule out bacterial infection or co-infection with other viruses. If result is PRESUMPTIVE POSTIVE SARS-CoV-2 nucleic acids MAY BE PRESENT.   A presumptive positive result was obtained on the submitted specimen  and confirmed on repeat testing.  While 2019 novel coronavirus  (SARS-CoV-2) nucleic acids may be present in the submitted sample  additional confirmatory testing may be necessary for epidemiological  and / or clinical management purposes  to differentiate between  SARS-CoV-2 and other Sarbecovirus currently known to infect humans.  If clinically indicated additional testing with an alternate test  methodology [id number]) is advised. The SARS-CoV-2 RNA is generally  detectable in upper and lower respiratory sp ecimens during the acute  phase of infection. The expected result is Negative. Fact Sheet for Patients:  StrictlyIdeas.no Fact Sheet for Healthcare Providers: BankingDealers.co.za This test is not yet approved or cleared by the [place] FDA and has been authorized for detection and/or diagnosis of SARS-CoV-2 by FDA under an Emergency Use Authorization (EUA).  This EUA will remain in effect (meaning this test can be used) for the duration of the COVID-19 declaration under Section 564(b)(1) of the Act, 21 U.S.C. section 360bbb-3(b)(1),  unless the authorization is terminated or revoked sooner. Performed at [facility], [place] [address] [place], [place] [address]          Radiology Studies: No results found.      Scheduled Meds: . aspirin  325 mg Oral Daily  . cholecalciferol  1,000 Units Oral Daily  . docusate sodium  100 mg Oral BID  . enoxaparin (LOVENOX) injection  40 mg Subcutaneous Q24H  . pantoprazole  40 mg Oral Daily  . sodium chloride flush  3 mL Intravenous Once   Continuous Infusions: . sodium chloride 75 mL/hr at 09/05/19 0923     LOS: 2 days    Time spent: 36 minutes.     Hosie Poisson, MD [facility] Pager [phone]  If 7PM-7AM, please contact night-coverage [email or web address] Password [facility] 09/05/2019, 10:25 AM

## 2019-09-05 NOTE — Progress Notes (Addendum)
NEUROLOGY PROGRESS NOTE  Subjective: Patient currently is very drowsy.  He is able to follow commands.  Complaining of abdominal pain  Exam: Vitals:   09/04/19 2325 09/05/19 0356  BP: 122/75 133/84  Pulse: 98 91  Resp: 18 18  Temp: 98.7 F (37.1 C) 99.3 F (37.4 C)  SpO2: 99% 100%    Physical Exam   HEENT-  Normocephalic, no lesions, without obvious abnormality.  Normal external eye and conjunctiva.   Extremities- Warm, dry and intact Musculoskeletal-no joint tenderness, deformity or swelling Skin-warm and dry, no hyperpigmentation, vitiligo, or suspicious lesions    Neuro:  Mental Status: Drowsy, oriented Alexander Strickland, hospital, September.  Dysarthric speech.  Able to follow simple step commands without difficulty. Cranial Nerves: II: Able to count fingers in all quadrants III,IV, VI: ptosis not present, extra-ocular motions intact bilaterally pupils equal, round, reactive to light and accommodation V,VII: Face symmetric, facial light touch sensation normal bilaterally VIII: hearing normal bilaterally  Motor: Moving all extremities antigravity.  Effort dependent but when does show good effort he has good strength especially on the left upper extremity and bilateral hip flexion Sensory: Pinprick and light touch intact throughout, bilaterally Deep Tendon Reflexes: Draws from noxious stimuli in all 4 extremities Plantars: Right: downgoing   Left: downgoing     Medications:  Scheduled: . aspirin  325 mg Oral Daily  . cholecalciferol  1,000 Units Oral Daily  . docusate sodium  100 mg Oral BID  . enoxaparin (LOVENOX) injection  40 mg Subcutaneous Q24H  . pantoprazole  40 mg Oral Daily  . sodium chloride flush  3 mL Intravenous Once   Continuous: . sodium chloride     [id number] (DILAUDID) injection, LORazepam, naphazoline-pheniramine, ondansetron, oxyCODONE, oxyCODONE-acetaminophen  Pertinent Labs/Diagnostics: LDL 103 HbA1c pending EEG that was obtained on  09/03/2019 suggestive of cortical dysfunction in the right occipital region consistent with underlying infarct.  Also evidence of moderate diffuse encephalopathy.  No epileptiform discharges were noted  Echocardiogram: Normal left ventricular systolic function with a EF of 60-65%.  No intraseptal shunt.  MRI will also be attempted again today.  Ct Head Code Stroke Wo Contrast  Result Date: 09/03/2019 . 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 No definite acute infarct. No hematoma. Electronically Signed   By: Monte Fantasia M.D.   On: 09/03/2019 10:07     Saban Pegg PA-C [facility] [phone]   Assessment: Patient brought in with altered mental status with a glucose of 22.  Patient was noted to have seizure with correction of hypoglycemia.  Patient continues to be encephalopathic.  Stroke work-up currently being obtained Impression:  - Acute Metabolic Encephalopathy - Hypoglycemia induced seizure -- Possible acute stroke, had subacute/chronic R occipital lobe infarction   Recommendations: -Awaiting MRI brain -Palliative care following  Dr. [name] to make final recommendations and changes in note   09/05/2019, 8:45 AM   NEUROHOSPITALIST ADDENDUM Performed a face to face diagnostic evaluation.   I have reviewed the contents of history and physical exam as documented by PA/ARNP/Resident and agree with above documentation.  I have discussed and formulated the above plan as documented. Edits to the note have been made as needed.   Patient admitted for altered mental status in the setting of hypoglycemia with glucose of 22.  Patient noted to have a seizure with correction of hypoglycemia.  Patient remains somnolent and confused, unable to state month, age correctly.  He  also appears to be slightly weaker on the right upper extremity compared to left.  Also  has left right confusion.  Difficult to assess neglect on the right. MRI brain is pending, EEG obtained showed cortical dysfunction in the right occipital region with no epileptiform discharges seen.  We will follow MRI brain.  Continue treating underlying metabolic disturbances such as hypoglycemia, leukocytosis and hyponatremia   [name] Aroor MD [facility] [phone]   If 7pm to 7am, please call on call as listed on AMION.

## 2019-09-05 NOTE — Progress Notes (Signed)
[facility] [id number] [facility]) [facility] Admission  Mr. Donia was transported by EMS to [facility] ED following seizure like activity and altered level of consciousness.  CBG 22 en route to ED.  Code Stroke initiated.  CT revealed parietal infarcts.  Mr. Marone is under hospice services with a terminal diagnosis of pancreatic cancer per Dr. Tomasa Hosteller.  He is admitted with seizures, hypoglycemia and altered level of consciousness.  This is a related hospital admission.  Patient is lethargic and moaning in pain today but able to open his eyes and follow minimal commands. Wife was at bedside when MD made visits but did not answer when liaison attempted to contact her.   VS: 98.4, 124/83, 93, 18, 100% RA I/O: 720.2/500 Abn Labs: Na 133, CO 220, Calcium 8.1, Alkaline phos 440, Albumin 1.9, AST 87, ALT 48, Total protein 5.8, total bilirubin 2.1, HDL cholesterol 11, LDL 103, WBC, 18.0, RBC 2.62, Hgb 7.3, HCT 22.7, RDW 19.6, Platelets 105  An MR of the brain has been ordered but not completed at the time of this note.  Medications: NS@75ml /hr, oxycodone 5mg  PRN Q4hrs @0512 , Oxycodone acetaminophen 5-325mg  PRN Q4hs @0926   Per MD notes: Neurology: Result Date: 09/03/2019 . 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 No definite acute infarct. No hematoma.  Attending:  Active Problems:   Hypoglycemia   Altered mental status   Palliative care encounter Acute encephalopathy probably secondary to the stroke Moderate infarct in the right occipital and low parietal lobes with extensive surface mineralization suggesting subacute infarct  MRI of the brain ordered for further evaluation. Disposition Plan: Pending further evaluation and clinical improvement possibly back home with home hospice after stroke work-up is done.  IDT:  Updated  Family:  Left VM for return call.   Goals of Care:  Unclear,  full code. Palliative Consult over weekend. Wife is aware patient may have had stroke and that his cognitive function statu may be irrevocably altered. The possibility that he may be nearing end-of-life was discused. Wife continues to be optimistic regarding his chances for meaningful improvement.  She also remains committed to the current scope of treatment.  D/C planning:  Ongoing, tentatively to return home with wife and continue hospice support.  Please use [facility] for ambulance transport if needed at discharge.   Transfer summary and med list sent to unit to be placed on shadow chart.  Thank you, Farrel Gordon, RN, [place] (in [place]) [phone]

## 2019-09-05 NOTE — Progress Notes (Signed)
  Speech Language Pathology Treatment: Dysphagia  Patient Details Name: Alexander Strickland MRN: [id number] DOB: 1946/10/29 Today's Date: 09/05/2019 Time: 1550-1600 SLP Time Calculation (min) (ACUTE ONLY): 10 min  Assessment / Plan / Recommendation Clinical Impression  Pt was encountered awake/alert lying semi-reclined in bed and he was agreeable to ST tx session. Pt was observed with trials of regular solids and thin liquid and he did not exhibit clinical s/sx of aspiration with any trials.  Pt presented with impulsivity evidenced by taking large bites and sips throughout this session.  Pt exhibited mildly prolonged, but effective mastication of regular solids with minimal oral residue.  Pt cleared residue with cued liquid wash.  Pt required some assistance and verbal cues to self feed during po trials secondary to suspected cognitive deficits.  Pt would benefit from supervision and assistance during meals to aid in self feeding and to cue for safe swallow strategies (small bites/sips, sit upright as possible, etc.)  Recommend continuation of a Dysphagia 3 (mech soft)/Thin liquid diet and consideration of a cognitive-linguistic evaluation in the setting of AMS and possible acute CVA.    HPI HPI: [name]  is a 73 y.o. male, with past medical history significant for metastatic pancreatic cancer, anemia and diabetes mellitus on insulin, presenting with an episode of altered mental status and seizure today early in a.m.  Patient is on Lantus at home and he was advised to decrease his dose yesterday to 9 units subcu nightly.  Today he started having some jerking of the right arm with possible posturing.  He also seemed weak on the left side compared to right and received Versed IV for jerking and left gaze preference sodium 133 potassium 4.5 alkaline phosphatase 440, white blood cell count 18 hemoglobin 7.3 platelets 105 INR 1.7.  Patient was also found to be hypoglycemic in the emergency room and he was  started on D10 water.  CT of the head was showing moderate infarct in the right occipital and low parietal lobes with extensive surface mineralization suggesting subacute to remote timing. No definite acute infarct. No hematoma.  MRI of the head was attempted and unable to be completed.        SLP Plan  Continue with current plan of care       Recommendations  Diet recommendations: Dysphagia 3 (mechanical soft);Thin liquid Liquids provided via: Cup;Straw Medication Administration: Whole meds with liquid Supervision: Full supervision/cueing for compensatory strategies Compensations: Minimize environmental distractions;Small sips/bites Postural Changes and/or Swallow Maneuvers: Seated upright 90 degrees                Oral Care Recommendations: Oral care BID Follow up Recommendations: Other (comment)(TBD) SLP Visit Diagnosis: Dysphagia, unspecified (R13.10) Plan: Continue with current plan of care       Bretta Bang, M.S., [place] Office: [phone]               [place] 09/05/2019, 4:04 PM

## 2019-09-05 NOTE — Evaluation (Signed)
Physical Therapy Evaluation Patient Details Name: Alexander Strickland MRN: [id number] DOB: [birth year] Today's Date: 09/05/2019   History of Present Illness  73yo male presenting with AMS and seizures (witnessed in ED), hypoglycemic in ED. Also found to have subacte parietal and occipital lobe infarcts, no tpa given. PMH metastatic pancreatic CA, DM, HTN, R THR, B TKRs, L RCR  Clinical Impression   Patient received in bed, wife present in room and observed entirety of session this morning. Patient pleasant and willing to participate in PT session today, required Min guard for bed mobility with assist for line management, and initially required ModAx2 for standing with heavy cues for upright posture once standing. Easily fatigued today. Able to perform second sit to stand with MinAx1, however did require Min-ModAx2 for balance for 3-4 sidesteps alongside edge of bed, and was returned to supine with min guard and assist for line management. Slight impulsivity noted with mobility. He will continue to benefit from skilled PT services in the acute setting, also recommend HHPT, 24/7 physical assist, and daily personal care attendant (if hospice is agreeable to this POC).     Follow Up Recommendations Home health PT;Supervision/Assistance - 24 hour;Other (comment)(would also recommend daily personal care attendants; recommendations are if Hospice approves)    [facility] bed;Rolling walker with 5" wheels    Recommendations for Other Services       Precautions / Restrictions Precautions Precautions: Fall;Other (comment) Precaution Comments: weakness L UE Restrictions Weight Bearing Restrictions: No      Mobility  Bed Mobility Overal bed mobility: Needs Assistance Bed Mobility: Supine to Sit;Sit to Supine     Supine to sit: Min guard Sit to supine: Min guard   General bed mobility comments: min guard for safety and line management, extended time, no physical assist  given  Transfers Overall transfer level: Needs assistance Equipment used: 2 person hand held assist Transfers: Sit to/from Stand Sit to Stand: Mod assist;Min assist         General transfer comment: initial stand ModAx2, heavy VC for upright posture; on second stand, able to perform with MinAx2  Ambulation/Gait Ambulation/Gait assistance: Min assist;Mod assist           General Gait Details: sidestepping alongside bed, Min-ModAx2 for balance and safety  Stairs            Wheelchair Mobility    Modified Rankin (Stroke Patients Only)       Balance Overall balance assessment: Needs assistance Sitting-balance support: Bilateral upper extremity supported;Feet supported Sitting balance-Leahy Scale: Fair Sitting balance - Comments: tends to lean anteriorly   Standing balance support: Bilateral upper extremity supported;During functional activity Standing balance-Leahy Scale: Poor Standing balance comment: reliant on external support                             Pertinent Vitals/Pain Pain Assessment: Faces Pain Score: 0-No pain Faces Pain Scale: No hurt Pain Intervention(s): Monitored during session    Home Living Family/patient expects to be discharged to:: Private residence Living Arrangements: Spouse/significant other Available Help at Discharge: Family;Available 24 hours/day Type of Home: House Home Access: Level entry     Home Layout: One level Home Equipment: Walker - 2 wheels;Cane - single point Additional Comments: wife reports they have a nurse that comes once a week    Prior Function Level of Independence: Independent         Comments: per wife, he could get up to  bathroom and walk short distances in the home independently     Hand Dominance        Extremity/Trunk Assessment   Upper Extremity Assessment Upper Extremity Assessment: Defer to OT evaluation    Lower Extremity Assessment Lower Extremity Assessment:  Generalized weakness    Cervical / Trunk Assessment Cervical / Trunk Assessment: Normal  Communication   Communication: No difficulties  Cognition Arousal/Alertness: Awake/alert Behavior During Therapy: WFL for tasks assessed/performed Overall Cognitive Status: Impaired/Different from baseline Area of Impairment: Orientation;Attention;Memory;Following commands;Safety/judgement;Awareness;Problem solving                 Orientation Level: Time;Situation;Disoriented to Current Attention Level: Sustained Memory: Decreased short-term memory;Decreased recall of precautions Following Commands: Follows one step commands consistently;Follows one step commands with increased time Safety/Judgement: Decreased awareness of deficits;Decreased awareness of safety Awareness: Intellectual Problem Solving: Slow processing;Decreased initiation;Difficulty sequencing;Requires verbal cues;Requires tactile cues        General Comments      Exercises     Assessment/Plan    PT Assessment Patient needs continued PT services  PT Problem List Decreased strength;Decreased mobility;Decreased safety awareness;Decreased coordination;Decreased knowledge of precautions;Decreased activity tolerance;Decreased balance;Decreased knowledge of use of DME       PT Treatment Interventions DME instruction;Therapeutic activities;Gait training;Therapeutic exercise;Cognitive remediation;Patient/family education;Stair training;Balance training;Functional mobility training;Neuromuscular re-education    PT Goals (Current goals can be found in the Care Plan section)  Acute Rehab PT Goals [name] PT Goal Formulation: With patient/family Time For Goal Achievement: 09/19/19 Potential to Achieve Goals: Good    Frequency Min 3X/week   Barriers to discharge        Co-evaluation PT/OT/SLP Co-Evaluation/Treatment: Yes Reason for Co-Treatment: Complexity of the patient's impairments  (multi-system involvement);Necessary to address cognition/behavior during functional activity;For patient/therapist safety;To address functional/ADL transfers PT goals addressed during session: Mobility/safety with mobility;Balance         AM-PAC PT "6 Clicks" Mobility  Outcome Measure Help needed turning from your back to your side while in a flat bed without using bedrails?: A Little Help needed moving from lying on your back to sitting on the side of a flat bed without using bedrails?: A Little Help needed moving to and from a bed to a chair (including a wheelchair)?: A Lot Help needed standing up from a chair using your arms (e.g., wheelchair or bedside chair)?: A Lot Help needed to walk in hospital room?: A Lot Help needed climbing 3-5 steps with a railing? : A Lot 6 Click Score: 14    End of Session Equipment Utilized During Treatment: Gait belt Activity Tolerance: Patient limited by fatigue;Patient tolerated treatment well Patient left: in bed;with call bell/phone within reach;with bed alarm set;with family/visitor present   PT Visit Diagnosis: Unsteadiness on feet (R26.81);Muscle weakness (generalized) (M62.81);Difficulty in walking, not elsewhere classified (R26.2);Other symptoms and signs involving the nervous system (R29.898)    Time: [id number] PT Time Calculation (min) (ACUTE ONLY): 24 min   Charges:   PT Evaluation $PT Eval Moderate Complexity: 1 Mod          Deniece Ree PT, DPT, CBIS  Supplemental Physical Therapist [place]    Pager [phone] Acute Rehab Office [phone]

## 2019-09-06 LAB — URINALYSIS, ROUTINE W REFLEX MICROSCOPIC
Bacteria, UA: NONE SEEN
Glucose, UA: 50 mg/dL — AB
Hgb urine dipstick: NEGATIVE
Ketones, ur: NEGATIVE mg/dL
Leukocytes,Ua: NEGATIVE
Nitrite: NEGATIVE
Protein, ur: 30 mg/dL — AB
Specific Gravity, Urine: 1.024 (ref 1.005–1.030)
pH: 5 (ref 5.0–8.0)

## 2019-09-06 LAB — HEMOGLOBIN A1C
Hgb A1c MFr Bld: 4.9 % (ref 4.8–5.6)
Mean Plasma Glucose: 94 mg/dL

## 2019-09-06 LAB — GLUCOSE, CAPILLARY: Glucose-Capillary: 147 mg/dL — ABNORMAL HIGH (ref 70–99)

## 2019-09-06 MED ORDER — ASPIRIN 325 MG PO TABS: 325.0000 mg | ORAL_TABLET | Freq: Every day | ORAL | 1 refills | Status: AC

## 2019-09-06 NOTE — Progress Notes (Signed)
SLP Cancellation Note  Patient Details Name: Alexander Strickland MRN: [id number] DOB: 07-17-1946   Cancelled treatment:       Reason Eval/Treat Not Completed: Other (comment)(Per note from Bevely Palmer, RN, Pt's wife does not want the pt to have any further stroke work-up as patient is palliative care and will be home hospice. SLP will therefore sign off at this time. Please re-consult if goals of care change or if clinically indicated.)  Deloy Archey I. Hardin Negus, [place], [place] Office number [phone] Pager [employer] 09/06/2019, 2:40 PM

## 2019-09-06 NOTE — TOC Initial Note (Signed)
Transition of Care [facility]) - Initial/Assessment Note    Patient Details  Name: Alexander Strickland MRN: [id number] Date of Birth: 05-30-1946  [facility] CM/SW Contact:    Pollie Friar, RN Phone Number: 09/06/2019, 1:26 PM  Clinical Narrative:                 Pt with some confusion so CM reached out to patients spouse. Pt was active with [facility] prior to admission. Wife wants to continue with [facility] but needs additional DME in the home. CM reached out to Bevely Palmer with [facility] to inform her of DME needs.  Wife states her nephew is able to assist her some at home.  Plan is for home in am once DME has been delivered to the room.   Expected Discharge Plan: Home w Hospice Care Barriers to Discharge: Continued Medical Work up   Patient Goals and CMS Choice   CMS Medicare.gov Compare Post Acute Care list provided to:: Patient Represenative (must comment) Choice offered to / list presented to : Spouse  Expected Discharge Plan and Services Expected Discharge Plan: Home w Hospice Care   Discharge Planning Services: CM Consult Post Acute Care Choice: Hospice                                        Prior Living Arrangements/Services   Lives with:: Spouse                   Activities of Daily Living      Permission Sought/Granted                  Emotional Assessment              Admission diagnosis:  Hypoglycemia [E16.2] Altered mental status, unspecified altered mental status type [R41.82] Patient Active Problem List   Diagnosis Date Noted  . Altered mental status   . Palliative care encounter   . Hypoglycemia 09/03/2019  . Genetic testing 03/22/2019  . Family history of cancer   . Primary osteoarthritis of right hip 05/02/2016  . Dizziness - light-headed 07/03/2012  . Bradycardia 07/03/2012  . Hypertension 07/03/2012  . Chest pain 07/03/2012  . Anemia 07/03/2012   PCP:  Patient, No Pcp Per Pharmacy:   [facility] [place], [place] - [address] [place] [address] Phone: [phone] Fax: [phone]     Social Determinants of Health (SDOH) Interventions    Readmission Risk Interventions No flowsheet data found.

## 2019-09-06 NOTE — Progress Notes (Signed)
[facility] 3W16 [facility]) [place] [facility] Admission  Mr. Sia was transported by EMS to [facility] ED following seizure like activity and altered level of consciousness. CBG 22 en route to ED. Code Stroke initiated. CT revealed parietal infarcts. Mr. Scurti is under hospice services with a terminal diagnosis of pancreatic cancer per Dr. Tomasa Hosteller. He is admitted with seizures, hypoglycemia and altered level of consciousness. This is a related hospital admission.  Spoke with wife briefly, she states she stayed overnight at the hospital and was resting. Patient continues to be drowsy, some difficulty following commands. Alert and oriented to place but not time. Speech is dysarthric.   VS: 98.2, 134/91, 110, 17, 98% RA Abn Labs: no new labs today. MRI Brain: IMPRESSION: 1. The examination was discontinued prior to completion due to patient agitation. 2. Multiple scattered punctate mostly cortical acute infarcts in the superior frontal gyri suggest a recent Embolic Event. 3. Late subacute to early chronic infarct in the posterior right hemisphere as seen by CT, posterior right MCA and right MCA/PCA watershed area. 4. No intracranial mass effect  Medications: NS @ 51ml/hr, MS contin 30mg  BID scheduled, Oxycodone  5mg  PRN Q4hrs [id number]  Per MD notes: Had discussion with wife about further stroke work-up.  She does not want to have any further stroke work-up as patient is palliative care and will be home hospice. 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  Goals of Care: Unclear, full code. Palliative Consult over weekend. Wife is aware patient may have had stroke and that his cognitive function statu may be irrevocably altered. The possibility that he may be nearing end-of-life was discused. Wife continues to be optimistic regarding his chances for meaningful improvement. She also remains committed  to the current scope of treatment.  D/C planning: Ongoing, tentatively to return home with wife and continue hospice support.  Please use [facility] for ambulance transport if needed at discharge.   Transfer summary and med list sent to unit to be placed on shadow chart.  Thank you, Farrel Gordon, RN, [place] (in [place]) [phone]

## 2019-09-06 NOTE — Progress Notes (Signed)
PROGRESS NOTE    Alexander Strickland  [id number] DOB: [birth year] DOA: 09/03/2019 PCP: Patient, No Pcp Per  Brief Narrative:   73 year old gentleman with prior history of metastatic pancreatic cancer  ( follows up with the [facility] oncology) on home hospice, anemia, diabetes mellitus insulin-dependent brought into ED for altered mental status and seizures.  Patient was found to be hypoglycemic on arrival.  Work-up in the ED showed right-sided occipital infarct and low parietal lobe infarcts possibly subacute.  He was admitted for further evaluation of stroke and hypoglycemia.  Hospice was contacted and palliative care consulted.  EEG was done on 09/03/2019 showed slow generalized activity maximal in the right occipital area.  And evidence of moderate diffuse encephalopathy but without any epileptiform discharges. Neurology was consulted and an MRI of the brain ordered for further evaluation.  patient is on hospice and he is full code,  palliative care consulted for goals of care and patient's wife would want to continue home hospice on discharge.  MRI of the brain showed Multiple scattered punctate mostly cortical acute infarcts in the superior frontal gyri suggest a recent Embolic Event. Late subacute to early chronic infarct in the posterior right hemisphere as seen by CT, posterior right MCA and right MCA/PCA watershed area. Discussed the results of the MRI with the patient's wife and neurology has also signed off.  We are currently waiting for hospital bed to be delivered to the patient's house prior to discharge.  No further recommendations as per neurology and patient's wife would like to take him home once the hospital bed is arranged.  Assessment & Plan:   Active Problems:   Hypoglycemia   Altered mental status   Palliative care encounter  Acute encephalopathy probably secondary to the stroke Moderate infarct in the right occipital and low parietal lobes with extensive surface mineralization  suggesting subacute infarct  MRI of the brain showed multiple scattered punctate mostly cortical acute infarcts in the superior frontal gyri suggest a recent Embolic Event. Late subacute to early chronic infarct in the posterior right hemisphere as seen by CT, posterior right MCA and right MCA/PCA watershed area. Neurology have signed off.  Patient's aspirin 162 mg change to 325 mg daily. LDL is 103, restarted statin , HDL is 11 and hemoglobin A1c is 4.5 Echocardiogram ordered showed normal systolic function with an ejection fraction of 60 to 65%, left ventricular diastolic parameters are consistent with impaired relaxation, no evidence of left ventricular regional wall motion abnormalities.  No intracardiac thrombi or masses were visualized.Marland Kitchen   SLP evaluation recommended dysphagia 3 diet with thin liquids Patient's wife would like to take patient home with hospice once hospital bed is delivered.  Metastatic pancreatic cancer Patient is on home hospice and currently not getting any treatment. Appreciate palliative care recommendations and will plan for home hospice on discharge. Resume home medications for pain control    Diabetes mellitus Better controlled CBGs. CBG (last 3)  Recent Labs    09/05/19 1653 09/05/19 2137 09/06/19 0638  GLUCAP 209* 221* 147*    Patient is on dysphagia 3 diet with thin liquids  no more episodes of hypoglycemia Patient passed swallow eval and continue his diet.    Leukocytosis Unclear etiology.  Possibly reactive COVID-19 screen is negative. No further work-up at this time.   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   Hemoglobin stable at 7.6 and platelets are at 117000.  No obvious signs of  bleeding   Elevated liver enzymes Probably secondary to metastatic pancreatic cancer.   Mild hyponatremia Probably secondary to dehydration and poor oral intake.  Continue with  gentle hydration and repeat BMP showed a sodium of 132.   DVT prophylaxis: Lovenox Code Status: Full code Family Communication: Family at bedside Disposition Plan: Home with home hospice tomorrow after the hospital bed is delivered.   Consultants:   Neurology  Palliative care  Procedures: MRI of brain Antimicrobials: None  Subjective: Patient seen and examined today more sleepy but appears comfortable.   Objective: Vitals:   09/05/19 2353 09/06/19 0440 09/06/19 1027 09/06/19 1107  BP: 132/74 137/72 138/79 (!) 134/91  Pulse: 91 97 (!) 107 (!) 110  Resp:  18 14 17   Temp: 99 F (37.2 C) 99.7 F (37.6 C) (!) 97.2 F (36.2 C) 98.2 F (36.8 C)  TempSrc: Oral Oral Oral Oral  SpO2: 100% 99% 100% 98%  Weight:      Height:        Intake/Output Summary (Last 24 hours) at 09/06/2019 1403 Last data filed at 09/06/2019 0800 Gross per 24 hour  Intake 884.55 ml  Output --  Net 884.55 ml   Filed Weights   09/03/19 1810  Weight: 81.6 kg    Examination:  General exam: Comfortably resting not in any kind of distress Respiratory system: Diminished at bases, no wheezing or rhonchi. Cardiovascular system: S1-S2 heard, regular rate rhythm, no pedal edema Gastrointestinal system: Abdomen is soft, has mild generalized tenderness, bowel sounds are heard Central nervous system: Patient sleeping comfortably is able to move his extremities spontaneously, gait and speech could not be tested. Extremities: No pedal edema, cyanosis or clubbing Skin: No rash Psychiatry: Appears comfortable   Data Reviewed: I have personally reviewed following labs and imaging studies  CBC: Recent Labs  Lab 09/03/19 1225 09/05/19 0609  WBC 18.0* 15.7*  NEUTROABS 15.1*  --   HGB 7.3* 7.6*  HCT 22.7* 23.3*  MCV 86.6 84.7  PLT 105* 123XX123*   Basic Metabolic Panel: Recent Labs  Lab 09/03/19 1225 09/05/19 0609  NA 133* 132*  K 4.5 4.0  CL 106 105  CO2 20* 20*  GLUCOSE 97 183*  BUN 21 17   CREATININE 1.08 0.92  CALCIUM 8.1* 7.9*   GFR: Estimated Creatinine Clearance: 73.9 mL/min (by C-G formula based on SCr of 0.92 mg/dL). Liver Function Tests: Recent Labs  Lab 09/03/19 1225  AST 87*  ALT 48*  ALKPHOS 440*  BILITOT 2.1*  PROT 5.8*  ALBUMIN 1.9*   No results for input(s): LIPASE, AMYLASE in the last 168 hours. No results for input(s): AMMONIA in the last 168 hours. Coagulation Profile: Recent Labs  Lab 09/03/19 1225  INR 1.7*   Cardiac Enzymes: No results for input(s): CKTOTAL, CKMB, CKMBINDEX, TROPONINI in the last 168 hours. BNP (last 3 results) No results for input(s): PROBNP in the last 8760 hours. HbA1C: Recent Labs    09/04/19 1410 09/05/19 0609  HGBA1C 4.5* 4.9   CBG: Recent Labs  Lab 09/05/19 0846 09/05/19 1127 09/05/19 1653 09/05/19 2137 09/06/19 0638  GLUCAP 195* 173* 209* 221* 147*   Lipid Profile: Recent Labs    09/05/19 0609  CHOL 128  HDL 11*  LDLCALC 103*  TRIG 70  CHOLHDL 11.6   Thyroid Function Tests: No results for input(s): TSH, T4TOTAL, FREET4, T3FREE, THYROIDAB in the last 72 hours. Anemia Panel: No results for input(s): VITAMINB12, FOLATE, FERRITIN, TIBC, IRON, RETICCTPCT in the last 72 hours. Sepsis  Labs: No results for input(s): PROCALCITON, LATICACIDVEN in the last 168 hours.  Recent Results (from the past 240 hour(s))  SARS Coronavirus 2 [facility] order, Performed in [facility] hospital lab) Nasopharyngeal Nasopharyngeal Swab     Status: None   Collection Time: 09/03/19 11:57 AM   Specimen: Nasopharyngeal Swab  Result Value Ref Range Status   SARS Coronavirus 2 NEGATIVE NEGATIVE Final    Comment: (NOTE) If result is NEGATIVE SARS-CoV-2 target nucleic acids are NOT DETECTED. The SARS-CoV-2 RNA is generally detectable in upper and lower  respiratory specimens during the acute phase of infection. The lowest  concentration of SARS-CoV-2 viral copies this assay can detect is 250  copies / mL. A negative  result does not preclude SARS-CoV-2 infection  and should not be used as the sole basis for treatment or other  patient management decisions.  A negative result may occur with  improper specimen collection / handling, submission of specimen other  than nasopharyngeal swab, presence of viral mutation(s) within the  areas targeted by this assay, and inadequate number of viral copies  (<250 copies / mL). A negative result must be combined with clinical  observations, patient history, and epidemiological information. If result is POSITIVE SARS-CoV-2 target nucleic acids are DETECTED. The SARS-CoV-2 RNA is generally detectable in upper and lower  respiratory specimens dur ing the acute phase of infection.  Positive  results are indicative of active infection with SARS-CoV-2.  Clinical  correlation with patient history and other diagnostic information is  necessary to determine patient infection status.  Positive results do  not rule out bacterial infection or co-infection with other viruses. If result is PRESUMPTIVE POSTIVE SARS-CoV-2 nucleic acids MAY BE PRESENT.   A presumptive positive result was obtained on the submitted specimen  and confirmed on repeat testing.  While 2019 novel coronavirus  (SARS-CoV-2) nucleic acids may be present in the submitted sample  additional confirmatory testing may be necessary for epidemiological  and / or clinical management purposes  to differentiate between  SARS-CoV-2 and other Sarbecovirus currently known to infect humans.  If clinically indicated additional testing with an alternate test  methodology [phone]) is advised. The SARS-CoV-2 RNA is generally  detectable in upper and lower respiratory sp ecimens during the acute  phase of infection. The expected result is Negative. Fact Sheet for Patients:  StrictlyIdeas.no Fact Sheet for Healthcare Providers: BankingDealers.co.za This test is not yet  approved or cleared by the [place] FDA and has been authorized for detection and/or diagnosis of SARS-CoV-2 by FDA under an Emergency Use Authorization (EUA).  This EUA will remain in effect (meaning this test can be used) for the duration of the COVID-19 declaration under Section 564(b)(1) of the Act, 21 U.S.C. section 360bbb-3(b)(1), unless the authorization is terminated or revoked sooner. Performed at [facility], [place] [address], [place], [place] [address]          Radiology Studies: Mr Brain 12 Contrast  Result Date: 09/05/2019 CLINICAL DATA:  73 year old male code stroke presentation earlier today with right side weakness and leftward gaze. EXAM: MRI HEAD WITHOUT CONTRAST TECHNIQUE: Multiplanar, multiecho pulse sequences of the brain and surrounding structures were obtained without intravenous contrast. COMPARISON:  Head CT without contrast 09/03/2019. FINDINGS: The examination had to be discontinued prior to completion due to patient agitation. Axial diffusion weighted imaging, motion degraded coronal diffusion weighted imaging, and motion degraded axial FLAIR imaging only was obtained. Heterogeneous diffusion in the right parietal lobe corresponding to the recent  CTA abnormality is mostly facilitated (series 3, image 36 and series 300, image 36). Additionally there are multiple punctate cortical areas of restricted diffusion in both superior frontal convexities (series 3 images 46 through 51). These are more numerous on the left. Occasional central white matter restricted diffusion. No deep gray nuclei or posterior fossa diffusion abnormality. The right parietal lobe lesion extends to the right MCA/PCA watershed area. No intracranial mass effect. No ventriculomegaly., as well patchy FLAIR as chronically hyperintensity in the affected areas scattered in the bilateral cerebral white matter. Chronic dilated perivascular space inferior to the left lentiform (normal variant).  IMPRESSION: 1. The examination was discontinued prior to completion due to patient agitation. 2. Multiple scattered punctate mostly cortical acute infarcts in the superior frontal gyri suggest a recent Embolic Event. 3. Late subacute to early chronic infarct in the posterior right hemisphere as seen by CT, posterior right MCA and right MCA/PCA watershed area. 4. No intracranial mass effect. Electronically Signed   By: Genevie Ann M.D.   On: 09/05/2019 20:11        Scheduled Meds:  aspirin  325 mg Oral Daily   atorvastatin  10 mg Oral Daily   cholecalciferol  1,000 Units Oral Daily   docusate sodium  100 mg Oral BID   enoxaparin (LOVENOX) injection  40 mg Subcutaneous Q24H   morphine  30 mg Oral BID   pantoprazole  40 mg Oral Daily   sodium chloride flush  3 mL Intravenous Once   Continuous Infusions:  sodium chloride 75 mL/hr at 09/05/19 2144     LOS: 3 days    Time spent: 30 minutes.     Hosie Poisson, MD [facility] Pager [phone]  If 7PM-7AM, please contact night-coverage [email or web address] Password [facility] 09/06/2019, 2:03 PM

## 2019-09-06 NOTE — Progress Notes (Signed)
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   [facility] Clinical Phone Call:  ADDENDUM: 02:52pm. LCSW called and spoke with Pt wife, Mrs. Konkel, and she was tearful/states cleaning her floor/got bad news from the hospital. She noted not wanting to talk about it right now but did indicate Pt had multiple strokes. She noted wanting POA for bill matters/states Pt knows who she is. She notes Pt coming home tomorrow and hospital bed coming tomorrow/states wanting to end conversation and LCSW can come by tomorrow. LCSW noted LCSW will call her tomorrow. LCSW spoke with primary RN And RN Liaison, Latanya Presser, after this phone call. MaryAnne indicates ordering hospital bed for Pt and tentative discharge for Pt tomorrow.  ADDENDUM: 08:48am. LCSW attempted call to Pt room number twice [phone]) and it was busy.  08:44am. LCSW left voice mail with Pt wife, Mrs. Maharaj, on her cell phone noting desire to check in on hers/Pt status. LCSW noted awareness of updates re: Pt status per RN Liaison/LCSW offered visit to hospital today if she plans to visit/LCSW requested call back and provided LCSW contact number.   Please call us with any questions or concerns.  Thank you,  Christena Deem, LCSW Licensed Clinical Therapist, art [phone]

## 2019-09-06 NOTE — Discharge Summary (Signed)
Physician Discharge Summary  Alexander Strickland [id number] DOB: Apr 28, 1946 DOA: 09/03/2019  PCP: Patient, No Pcp Per  Admit date: 09/03/2019 Discharge date: 09/06/2019  Admitted From: Home.  Disposition: home hospice.   Recommendations for Outpatient Follow-up:  1. Follow up with hospice MD as recommended.   Home health OT/PT  Equipment/Devices:hospital bed.   Discharge Condition: hospice / guarded.  CODE STATUS: Full code Diet recommendation: Dysphagia 3 diet  Brief/Interim Summary: 73 year old gentleman with prior history of metastatic pancreatic cancer  ( follows up with the [facility] oncology) on home hospice, anemia, diabetes mellitus insulin-dependent brought into ED for altered mental status and seizures.  Patient was found to be hypoglycemic on arrival.  Work-up in the ED showed right-sided occipital infarct and low parietal lobe infarcts possibly subacute.  He was admitted for further evaluation of stroke and hypoglycemia.  Hospice was contacted and palliative care consulted.  EEG was done on 09/03/2019 showed slow generalized activity maximal in the right occipital area.  And evidence of moderate diffuse encephalopathy but without any epileptiform discharges. Neurology was consulted and an MRI of the brain ordered for further evaluation.  patient is on hospice and he is full code,  palliative care consulted for goals of care and patient's wife would want to continue home hospice on discharge.  MRI of the brain showed Multiple scattered punctate mostly cortical acute infarcts in the superior frontal gyri suggest a recent Embolic Event. Late subacute to early chronic infarct in the posterior right hemisphere as seen by CT, posterior right MCA and right MCA/PCA watershed area. Discussed the results of the MRI with the patient's wife and neurology has also signed off.  We are currently waiting for hospital bed to be delivered to the patient's house prior to discharge.  No further  recommendations as per neurology and patient's wife would like to take him home once the hospital bed is arranged.   Discharge Diagnoses:  Active Problems:   Hypoglycemia   Altered mental status   Palliative care encounter  Acute encephalopathy probably secondary to the stroke Moderate infarct in the right occipital and low parietal lobes with extensive surface mineralization suggesting subacute infarct  MRI of the brain showed multiple scattered punctate mostly cortical acute infarcts in the superior frontal gyri suggest a recent Embolic Event. Late subacute to early chronic infarct in the posterior right hemisphere as seen by CT, posterior right MCA and right MCA/PCA watershed area. Neurology have signed off.  Patient's aspirin 162 mg change to 325 mg daily. LDL is 103, restarted statin , HDL is 11 and hemoglobin A1c is 4.5 Echocardiogram ordered showed normal systolic function with an ejection fraction of 60 to 65%, left ventricular diastolic parameters are consistent with impaired relaxation, no evidence of left ventricular regional wall motion abnormalities.  No intracardiac thrombi or masses were visualized.Marland Kitchen   SLP evaluation recommended dysphagia 3 diet with thin liquids Patient's wife would like to take patient home with hospice once hospital bed is delivered.  Metastatic pancreatic cancer Patient is on home hospice and currently not getting any treatment. Appreciate palliative care recommendations and will plan for home hospice on discharge. Resume home medications for pain control    Diabetes mellitus Better controlled CBGs. CBG (last 3)  Recent Labs (last 2 labs)        Recent Labs    09/05/19 1653 09/05/19 2137 09/06/19 0638  GLUCAP 209* 221* 147*      Patient is on dysphagia 3 diet with thin liquids  no  more episodes of hypoglycemia Patient passed swallow eval and continue his diet.    Leukocytosis Unclear etiology.  Possibly reactive COVID-19  screen is negative. No further work-up at this time.   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   Hemoglobin stable at 7.6 and platelets are at 117000.  No obvious signs of bleeding   Elevated liver enzymes Probably secondary to metastatic pancreatic cancer.   Mild hyponatremia Probably secondary to dehydration and poor oral intake.  Continue with gentle hydration and repeat BMP showed a sodium of 132.     Discharge Instructions   Allergies as of 09/06/2019      Reactions   Pollen Extract    Seasonal allergies      Medication List    STOP taking these medications   ibuprofen 800 MG tablet Commonly known as: ADVIL   Levemir FlexTouch 100 UNIT/ML Pen Generic drug: Insulin Detemir   NovoLOG FlexPen 100 UNIT/ML FlexPen Generic drug: insulin aspart   ondansetron 4 MG tablet Commonly known as: Zofran     TAKE these medications   amLODipine 5 MG tablet Commonly known as: NORVASC Take 1 tablet (5 mg total) by mouth daily. What changed: how much to take   aspirin 325 MG tablet Take 1 tablet (325 mg total) by mouth daily. Start taking on: September 07, 2019 What changed:   medication strength  how much to take  when to take this   atorvastatin 10 MG tablet Commonly known as: LIPITOR Take 10 mg by mouth daily.   [employer] Stool Softener/Laxative 8.6-50 MG tablet Generic drug: senna-docusate Take 100-200 mg by mouth 2 (two) times daily.   docusate sodium 100 MG capsule Commonly known as: Colace Take 1 capsule (100 mg total) by mouth 2 (two) times daily.   morphine 30 MG 12 hr tablet Commonly known as: MS CONTIN Take 30 mg by mouth 2 (two) times daily.   naphazoline-pheniramine 0.025-0.3 % ophthalmic solution Commonly known as: NAPHCON-A Place 1-2 drops into both eyes 4 (four) times daily as needed for irritation or allergies.   omeprazole 20 MG tablet Commonly known as:  PRILOSEC OTC Take 20 mg by mouth 2 (two) times daily.   oxyCODONE-acetaminophen 5-325 MG tablet Commonly known as: Roxicet Take 1-2 tablets by mouth every 4 (four) hours as needed for severe pain.   SINEX ULTRA FINE MIST 12-HOUR NA Place 2 sprays into the nose every 12 (twelve) hours as needed (For allergies.).   traMADol 50 MG tablet Commonly known as: ULTRAM Take 50 mg by mouth 2 (two) times daily as needed for moderate pain.   VITAMIN D3 PO Take 1 tablet by mouth daily.       Allergies  Allergen Reactions  . Pollen Extract     Seasonal allergies    Consultations:  Neurology  Palliative care    Procedures/Studies: Mr Brain 14 Contrast  Result Date: 09/05/2019 CLINICAL DATA:  73 year old male code stroke presentation earlier today with right side weakness and leftward gaze. EXAM: MRI HEAD WITHOUT CONTRAST TECHNIQUE: Multiplanar, multiecho pulse sequences of the brain and surrounding structures were obtained without intravenous contrast. COMPARISON:  Head CT without contrast 09/03/2019. FINDINGS: The examination had to be discontinued prior to completion due to patient agitation. Axial diffusion weighted imaging, motion degraded coronal diffusion weighted imaging, and motion degraded axial FLAIR imaging only was obtained. Heterogeneous diffusion in the right parietal lobe corresponding to the recent CTA abnormality is mostly facilitated (  series 3, image 36 and series 300, image 36). Additionally there are multiple punctate cortical areas of restricted diffusion in both superior frontal convexities (series 3 images 46 through 51). These are more numerous on the left. Occasional central white matter restricted diffusion. No deep gray nuclei or posterior fossa diffusion abnormality. The right parietal lobe lesion extends to the right MCA/PCA watershed area. No intracranial mass effect. No ventriculomegaly., as well patchy FLAIR as chronically hyperintensity in the affected areas  scattered in the bilateral cerebral white matter. Chronic dilated perivascular space inferior to the left lentiform (normal variant). IMPRESSION: 1. The examination was discontinued prior to completion due to patient agitation. 2. Multiple scattered punctate mostly cortical acute infarcts in the superior frontal gyri suggest a recent Embolic Event. 3. Late subacute to early chronic infarct in the posterior right hemisphere as seen by CT, posterior right MCA and right MCA/PCA watershed area. 4. No intracranial mass effect. Electronically Signed   By: Genevie Ann M.D.   On: 09/05/2019 20:11   Ct Head Code Stroke Wo Contrast  Result Date: 09/03/2019 CLINICAL DATA:  Code stroke. Left-sided gaze and right-sided weakness EXAM: CT HEAD WITHOUT CONTRAST TECHNIQUE: Contiguous axial images were obtained from the base of the skull through the vertex without intravenous contrast. COMPARISON:  01/17/2018 FINDINGS: Brain: Moderate area of cortical and white matter low-density in the right occipital lobe. Contiguous superior and anterior low-density with surface mineralization involving a moderate area. No hematoma, hydrocephalus, or solid masslike finding. 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 Vascular: No hyperdense vessel. Skull: Negative Sinuses/Orbits: Negative Other: These results were called by telephone at the time of interpretation on 09/03/2019 at 10:03 am to provider Rory Percy, who verbally acknowledged these results. ASPECTS [facility] Stroke Program Early CT Score) Not scored in the setting of subacute infarct findings. 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 No definite acute infarct. No hematoma. Electronically Signed   By: Monte Fantasia M.D.   On: 09/03/2019 10:07   Echocardiogram.  MRI brain without contrast.    Subjective: Resting comfortably.   Discharge Exam: Vitals:   09/06/19 1027 09/06/19 1107   BP: 138/79 (!) 134/91  Pulse: (!) 107 (!) 110  Resp: 14 17  Temp: (!) 97.2 F (36.2 C) 98.2 F (36.8 C)  SpO2: 100% 98%   Vitals:   09/05/19 2353 09/06/19 0440 09/06/19 1027 09/06/19 1107  BP: 132/74 137/72 138/79 (!) 134/91  Pulse: 91 97 (!) 107 (!) 110  Resp:  18 14 17   Temp: 99 F (37.2 C) 99.7 F (37.6 C) (!) 97.2 F (36.2 C) 98.2 F (36.8 C)  TempSrc: Oral Oral Oral Oral  SpO2: 100% 99% 100% 98%  Weight:      Height:        General:pt resting comfortably. No distress.  Cardiovascular: RRR, S1/S2 +, no rubs, no gallops Respiratory: CTA bilaterally, no wheezing, no rhonchi Abdominal: Soft, bowel sounds good.  Extremities: no edema, no cyanosis    The results of significant diagnostics from this hospitalization (including imaging, microbiology, ancillary and laboratory) are listed below for reference.     Microbiology: Recent Results (from the past 240 hour(s))  SARS Coronavirus 2 [facility] order, Performed in [facility] hospital lab) Nasopharyngeal Nasopharyngeal Swab     Status: None   Collection Time: 09/03/19 11:57 AM   Specimen: Nasopharyngeal Swab  Result Value Ref Range Status   SARS Coronavirus 2 NEGATIVE NEGATIVE Final  Comment: (NOTE) If result is NEGATIVE SARS-CoV-2 target nucleic acids are NOT DETECTED. The SARS-CoV-2 RNA is generally detectable in upper and lower  respiratory specimens during the acute phase of infection. The lowest  concentration of SARS-CoV-2 viral copies this assay can detect is 250  copies / mL. A negative result does not preclude SARS-CoV-2 infection  and should not be used as the sole basis for treatment or other  patient management decisions.  A negative result may occur with  improper specimen collection / handling, submission of specimen other  than nasopharyngeal swab, presence of viral mutation(s) within the  areas targeted by this assay, and inadequate number of viral copies  (<250 copies / mL). A negative result  must be combined with clinical  observations, patient history, and epidemiological information. If result is POSITIVE SARS-CoV-2 target nucleic acids are DETECTED. The SARS-CoV-2 RNA is generally detectable in upper and lower  respiratory specimens dur ing the acute phase of infection.  Positive  results are indicative of active infection with SARS-CoV-2.  Clinical  correlation with patient history and other diagnostic information is  necessary to determine patient infection status.  Positive results do  not rule out bacterial infection or co-infection with other viruses. If result is PRESUMPTIVE POSTIVE SARS-CoV-2 nucleic acids MAY BE PRESENT.   A presumptive positive result was obtained on the submitted specimen  and confirmed on repeat testing.  While 2019 novel coronavirus  (SARS-CoV-2) nucleic acids may be present in the submitted sample  additional confirmatory testing may be necessary for epidemiological  and / or clinical management purposes  to differentiate between  SARS-CoV-2 and other Sarbecovirus currently known to infect humans.  If clinically indicated additional testing with an alternate test  methodology [phone]) is advised. The SARS-CoV-2 RNA is generally  detectable in upper and lower respiratory sp ecimens during the acute  phase of infection. The expected result is Negative. Fact Sheet for Patients:  StrictlyIdeas.no Fact Sheet for Healthcare Providers: BankingDealers.co.za This test is not yet approved or cleared by the [place] FDA and has been authorized for detection and/or diagnosis of SARS-CoV-2 by FDA under an Emergency Use Authorization (EUA).  This EUA will remain in effect (meaning this test can be used) for the duration of the COVID-19 declaration under Section 564(b)(1) of the Act, 21 U.S.C. section 360bbb-3(b)(1), unless the authorization is terminated or revoked sooner. Performed at [facility], [place] [address], [place], [place] [address]      Labs: BNP (last 3 results) No results for input(s): BNP in the last 8760 hours. Basic Metabolic Panel: Recent Labs  Lab 09/03/19 1225 09/05/19 0609  NA 133* 132*  K 4.5 4.0  CL 106 105  CO2 20* 20*  GLUCOSE 97 183*  BUN 21 17  CREATININE 1.08 0.92  CALCIUM 8.1* 7.9*   Liver Function Tests: Recent Labs  Lab 09/03/19 1225  AST 87*  ALT 48*  ALKPHOS 440*  BILITOT 2.1*  PROT 5.8*  ALBUMIN 1.9*   No results for input(s): LIPASE, AMYLASE in the last 168 hours. No results for input(s): AMMONIA in the last 168 hours. CBC: Recent Labs  Lab 09/03/19 1225 09/05/19 0609  WBC 18.0* 15.7*  NEUTROABS 15.1*  --   HGB 7.3* 7.6*  HCT 22.7* 23.3*  MCV 86.6 84.7  PLT 105* 117*   Cardiac Enzymes: No results for input(s): CKTOTAL, CKMB, CKMBINDEX, TROPONINI in the last 168 hours. BNP: Invalid input(s): POCBNP CBG: Recent Labs  Lab 09/05/19 0846  09/05/19 1127 09/05/19 1653 09/05/19 2137 09/06/19 0638  GLUCAP 195* 173* 209* 221* 147*   D-Dimer No results for input(s): DDIMER in the last 72 hours. Hgb A1c Recent Labs    09/04/19 1410 09/05/19 0609  HGBA1C 4.5* 4.9   Lipid Profile Recent Labs    09/05/19 0609  CHOL 128  HDL 11*  LDLCALC 103*  TRIG 70  CHOLHDL 11.6   Thyroid function studies No results for input(s): TSH, T4TOTAL, T3FREE, THYROIDAB in the last 72 hours.  Invalid input(s): FREET3 Anemia work up No results for input(s): VITAMINB12, FOLATE, FERRITIN, TIBC, IRON, RETICCTPCT in the last 72 hours. Urinalysis    Component Value Date/Time   COLORURINE YELLOW 07/03/2012 [place] 07/03/2012 1645   LABSPEC 1.027 07/03/2012 1645   PHURINE 5.5 07/03/2012 1645   GLUCOSEU 100 (A) 07/03/2012 1645   HGBUR NEGATIVE 07/03/2012 1645   BILIRUBINUR NEGATIVE 07/03/2012 1645   KETONESUR NEGATIVE 07/03/2012 1645   PROTEINUR NEGATIVE 07/03/2012 1645   UROBILINOGEN 0.2 07/03/2012 1645    NITRITE NEGATIVE 07/03/2012 1645   LEUKOCYTESUR NEGATIVE 07/03/2012 1645   Sepsis Labs Invalid input(s): PROCALCITONIN,  WBC,  LACTICIDVEN Microbiology Recent Results (from the past 240 hour(s))  SARS Coronavirus 2 [facility] order, Performed in [facility] hospital lab) Nasopharyngeal Nasopharyngeal Swab     Status: None   Collection Time: 09/03/19 11:57 AM   Specimen: Nasopharyngeal Swab  Result Value Ref Range Status   SARS Coronavirus 2 NEGATIVE NEGATIVE Final    Comment: (NOTE) If result is NEGATIVE SARS-CoV-2 target nucleic acids are NOT DETECTED. The SARS-CoV-2 RNA is generally detectable in upper and lower  respiratory specimens during the acute phase of infection. The lowest  concentration of SARS-CoV-2 viral copies this assay can detect is 250  copies / mL. A negative result does not preclude SARS-CoV-2 infection  and should not be used as the sole basis for treatment or other  patient management decisions.  A negative result may occur with  improper specimen collection / handling, submission of specimen other  than nasopharyngeal swab, presence of viral mutation(s) within the  areas targeted by this assay, and inadequate number of viral copies  (<250 copies / mL). A negative result must be combined with clinical  observations, patient history, and epidemiological information. If result is POSITIVE SARS-CoV-2 target nucleic acids are DETECTED. The SARS-CoV-2 RNA is generally detectable in upper and lower  respiratory specimens dur ing the acute phase of infection.  Positive  results are indicative of active infection with SARS-CoV-2.  Clinical  correlation with patient history and other diagnostic information is  necessary to determine patient infection status.  Positive results do  not rule out bacterial infection or co-infection with other viruses. If result is PRESUMPTIVE POSTIVE SARS-CoV-2 nucleic acids MAY BE PRESENT.   A presumptive positive result was obtained  on the submitted specimen  and confirmed on repeat testing.  While 2019 novel coronavirus  (SARS-CoV-2) nucleic acids may be present in the submitted sample  additional confirmatory testing may be necessary for epidemiological  and / or clinical management purposes  to differentiate between  SARS-CoV-2 and other Sarbecovirus currently known to infect humans.  If clinically indicated additional testing with an alternate test  methodology [phone]) is advised. The SARS-CoV-2 RNA is generally  detectable in upper and lower respiratory sp ecimens during the acute  phase of infection. The expected result is Negative. Fact Sheet for Patients:  StrictlyIdeas.no Fact Sheet for Healthcare Providers: BankingDealers.co.za This test  is not yet approved or cleared by the [place] and has been authorized for detection and/or diagnosis of SARS-CoV-2 by FDA under an Emergency Use Authorization (EUA).  This EUA will remain in effect (meaning this test can be used) for the duration of the COVID-19 declaration under Section 564(b)(1) of the Act, 21 U.S.C. section 360bbb-3(b)(1), unless the authorization is terminated or revoked sooner. Performed at [facility], [place] [address], [place], [place] [address]      Time coordinating discharge: 32  minutes  SIGNED:   Hosie Poisson, MD  [facility] 09/06/2019, 2:17 PM Pager   If 7PM-7AM, please contact night-coverage [email or web address] Password [email or web address]

## 2019-09-06 NOTE — Plan of Care (Signed)
  Problem: Nutrition: Goal: Adequate nutrition will be maintained Outcome: Not Progressing   

## 2019-09-06 NOTE — Progress Notes (Signed)
NEUROLOGY PROGRESS NOTE  Subjective: Patient is extremely drowsy but when using very loud voice patient will awaken.  Has some difficulty following commands secondary to encephalopathy.   Exam: Vitals:   09/05/19 2353 09/06/19 0440  BP: 132/74 137/72  Pulse: 91 97  Resp:  18  Temp: 99 F (37.2 C) 99.7 F (37.6 C)  SpO2: 100% 99%    Physical Exam   HEENT-  Normocephalic, no lesions, without obvious abnormality.  Normal external eye and conjunctiva.   Extremities- Warm, dry and intact Musculoskeletal-no joint tenderness, deformity or swelling Skin-warm and dry, no hyperpigmentation, vitiligo, or suspicious lesions    Neuro:  Mental Status: Alert, oriented to hospital, believes it is March will not answer any other questions.  Speech is dysarthric.  Able to follow some simple steps with multiple cues.  Cranial Nerves: II:  Visual fields grossly normal,  III,IV, VI: ptosis not present, extra-ocular motions intact bilaterally pupils equal, round, reactive to light and accommodation V,VII: Face symmetric, VIII: hearing normal bilaterally IX,X: Palate rises midline XI: bilateral shoulder shrug XII: midline tongue extension Motor: Right : Upper extremity   4/5    Left:     Upper extremity   5/5  Lower extremity   5/5     Lower extremity   5/5 Tone :normal tone throughout; mild atrophy noted Sensory: Grimaces to noxious stimuli in all 4 extremities   Medications:  Scheduled: . aspirin  325 mg Oral Daily  . atorvastatin  10 mg Oral Daily  . cholecalciferol  1,000 Units Oral Daily  . docusate sodium  100 mg Oral BID  . enoxaparin (LOVENOX) injection  40 mg Subcutaneous Q24H  . morphine  30 mg Oral BID  . pantoprazole  40 mg Oral Daily  . sodium chloride flush  3 mL Intravenous Once   Continuous: . sodium chloride 75 mL/hr at 09/05/19 2144    Pertinent Labs/Diagnostics: - LDL 103 -A1c pending   Mr Brain Wo Contrast  Result Date: 09/05/2019 CLINICAL DATA:  .  IMPRESSION: 1. The examination was discontinued prior to completion due to patient agitation. 2. Multiple scattered punctate mostly cortical acute infarcts in the superior frontal gyri suggest a recent Embolic Event. 3. Late subacute to early chronic infarct in the posterior right hemisphere as seen by CT, posterior right MCA and right MCA/PCA watershed area. 4. No intracranial mass effect. Electronically Signed   By: Genevie Ann M.D.   On: 09/05/2019 20:11     Kaj Todoroff PA-C [facility] [phone]   Assessment: Patient admitted for altered mental status in setting of hypoglycemia with a glucose of 22.  Patient noted to have seizure with correction of hypoglycemia.  Throughout hospitalization patient remained somnolent and confused, able to get location correct but continues to get statement incorrectly.  Patient is slightly weaker on the right upper extremity.  MRI obtained showing embolic emboli.  EEG obtained showing cortical dysfunction in the right occipital region with no epileptiform discharges.  Echocardiogram showing 60 to 65% EF with no PFO.  Had discussion with wife about further stroke work-up.  She does not want to have any further stroke work-up as patient is palliative care and will be home hospice. Impression:  - Encephalopathy likely secondary to metabolic and scattered embolic strokes  Recommendations: -No further work-up for stroke - At this time neurology will sign off    09/06/2019, 9:48 AM

## 2019-09-07 DIAGNOSIS — R400 Somnolence: Secondary | ICD-10-CM

## 2019-09-07 LAB — GLUCOSE, CAPILLARY
Glucose-Capillary: 147 mg/dL — ABNORMAL HIGH (ref 70–99)
Glucose-Capillary: 169 mg/dL — ABNORMAL HIGH (ref 70–99)
Glucose-Capillary: 183 mg/dL — ABNORMAL HIGH (ref 70–99)
Glucose-Capillary: 155 mg/dL — ABNORMAL HIGH (ref 70–99)
Glucose-Capillary: 195 mg/dL — ABNORMAL HIGH (ref 70–99)

## 2019-09-07 MED ORDER — HEPARIN SOD (PORK) LOCK FLUSH 100 UNIT/ML IV SOLN
500.0000 [IU] | INTRAVENOUS | Status: AC | PRN
  Administered 2019-09-07: 500 [IU]

## 2019-09-07 NOTE — TOC Transition Note (Signed)
Transition of Care [facility]) - CM/SW Discharge Note   Patient Details  Name: Alexander Strickland MRN: [id number] Date of Birth: 1945/12/25  Transition of Care [facility]) CM/SW Contact:  Pollie Friar, RN Phone Number: 09/07/2019, 1:10 PM   Clinical Narrative:    Received information from wife that bed has been delivered to the home.  Pt transporting home via [facility]. Wife, bedside RN and hospice made aware.  D/c packet is at the desk.    Final next level of care: Home w Hospice Care Barriers to Discharge: No Barriers Identified   Patient Goals and CMS Choice   CMS Medicare.gov Compare Post Acute Care list provided to:: Patient Represenative (must comment) Choice offered to / list presented to : Spouse  Discharge Placement                       Discharge Plan and Services   Discharge Planning Services: CM Consult Post Acute Care Choice: Hospice                               Social Determinants of Health (SDOH) Interventions     Readmission Risk Interventions No flowsheet data found.

## 2019-09-07 NOTE — Progress Notes (Signed)
PROGRESS NOTE    Revon Sutter  [id number] DOB: Dec 17, 1946 DOA: 09/03/2019 PCP: Patient, No Pcp Per    Brief Narrative:   73 year old gentleman with metastatic pancreatic cancer, anemia, insulin-dependent diabetes, seizures and now with stroke, poor outcome and currently with altered mental status and at terminal stage of his life.  Assessment & Plan:   Active Problems:   Hypoglycemia   Altered mental status   Palliative care encounter  All symptomatic treatments available. Followed by hospice at [facility]. Waiting for DME supplies at home. Discharge summary reviewed from 09/06/2019 as done by Dr. Karleen Hampshire and no changes needed.    DVT prophylaxis: Lovenox Code Status: Full code Family Communication: None Disposition Plan: Home with hospice, when arrangements are made   Consultants:   Hospice  Neurology  Procedures:   None  Antimicrobials:   None   Subjective: Patient seen and examined.  Remains altered.  No overnight events.  Objective: Vitals:   09/06/19 2053 09/06/19 2351 09/07/19 0331 09/07/19 0851  BP: 137/71 (!) 143/78 128/72 136/74  Pulse: 96 94 97 (!) 106  Resp: 16 14 17 16   Temp: 97.8 F (36.6 C) 97.7 F (36.5 C) 97.7 F (36.5 C) 98.4 F (36.9 C)  TempSrc: Oral Oral Oral Axillary  SpO2: 100% 100% 99% 99%  Weight:      Height:        Intake/Output Summary (Last 24 hours) at 09/07/2019 1139 Last data filed at 09/07/2019 1000 Gross per 24 hour  Intake 2688.54 ml  Output 600 ml  Net 2088.54 ml   Filed Weights   09/03/19 1810  Weight: 81.6 kg    Examination:  General exam: Appears calm and comfortable, on room air.  Not in any distress. Respiratory system: Clear to auscultation. Respiratory effort normal. Cardiovascular system: S1 & S2 heard, RRR. No JVD, murmurs, rubs, gallops or clicks. No pedal edema. Gastrointestinal system: Abdomen is nondistended, soft and nontender. No organomegaly or masses felt. Normal bowel sounds  heard. Central nervous system: Sleepy and lethargic.  No participation.     Data Reviewed: I have personally reviewed following labs and imaging studies  CBC: Recent Labs  Lab 09/03/19 1225 09/05/19 0609  WBC 18.0* 15.7*  NEUTROABS 15.1*  --   HGB 7.3* 7.6*  HCT 22.7* 23.3*  MCV 86.6 84.7  PLT 105* [id number]*   Basic Metabolic Panel: Recent Labs  Lab 09/03/19 1225 09/05/19 0609  NA 133* 132*  K 4.5 4.0  CL 106 105  CO2 20* 20*  GLUCOSE 97 183*  BUN 21 17  CREATININE 1.08 0.92  CALCIUM 8.1* 7.9*   GFR: Estimated Creatinine Clearance: 73.9 mL/min (by C-G formula based on SCr of 0.92 mg/dL). Liver Function Tests: Recent Labs  Lab 09/03/19 1225  AST 87*  ALT 48*  ALKPHOS 440*  BILITOT 2.1*  PROT 5.8*  ALBUMIN 1.9*   No results for input(s): LIPASE, AMYLASE in the last 168 hours. No results for input(s): AMMONIA in the last 168 hours. Coagulation Profile: Recent Labs  Lab 09/03/19 1225  INR 1.7*   Cardiac Enzymes: No results for input(s): CKTOTAL, CKMB, CKMBINDEX, TROPONINI in the last 168 hours. BNP (last 3 results) No results for input(s): PROBNP in the last 8760 hours. HbA1C: Recent Labs    09/04/19 1410 09/05/19 0609  HGBA1C 4.5* 4.9   CBG: Recent Labs  Lab 09/05/19 1127 09/05/19 1653 09/05/19 2137 09/06/19 0638 09/07/19 0656  GLUCAP 173* 209* 221* 147* 155*   Lipid Profile: Recent Labs  09/05/19 0609  CHOL 128  HDL 11*  LDLCALC 103*  TRIG 70  CHOLHDL 11.6   Thyroid Function Tests: No results for input(s): TSH, T4TOTAL, FREET4, T3FREE, THYROIDAB in the last 72 hours. Anemia Panel: No results for input(s): VITAMINB12, FOLATE, FERRITIN, TIBC, IRON, RETICCTPCT in the last 72 hours. Sepsis Labs: No results for input(s): PROCALCITON, LATICACIDVEN in the last 168 hours.  Recent Results (from the past 240 hour(s))  SARS Coronavirus 2 [facility] order, Performed in [facility] hospital lab) Nasopharyngeal Nasopharyngeal Swab     Status:  None   Collection Time: 09/03/19 11:57 AM   Specimen: Nasopharyngeal Swab  Result Value Ref Range Status   SARS Coronavirus 2 NEGATIVE NEGATIVE Final    Comment: (NOTE) If result is NEGATIVE SARS-CoV-2 target nucleic acids are NOT DETECTED. The SARS-CoV-2 RNA is generally detectable in upper and lower  respiratory specimens during the acute phase of infection. The lowest  concentration of SARS-CoV-2 viral copies this assay can detect is 250  copies / mL. A negative result does not preclude SARS-CoV-2 infection  and should not be used as the sole basis for treatment or other  patient management decisions.  A negative result may occur with  improper specimen collection / handling, submission of specimen other  than nasopharyngeal swab, presence of viral mutation(s) within the  areas targeted by this assay, and inadequate number of viral copies  (<250 copies / mL). A negative result must be combined with clinical  observations, patient history, and epidemiological information. If result is POSITIVE SARS-CoV-2 target nucleic acids are DETECTED. The SARS-CoV-2 RNA is generally detectable in upper and lower  respiratory specimens dur ing the acute phase of infection.  Positive  results are indicative of active infection with SARS-CoV-2.  Clinical  correlation with patient history and other diagnostic information is  necessary to determine patient infection status.  Positive results do  not rule out bacterial infection or co-infection with other viruses. If result is PRESUMPTIVE POSTIVE SARS-CoV-2 nucleic acids MAY BE PRESENT.   A presumptive positive result was obtained on the submitted specimen  and confirmed on repeat testing.  While 2019 novel coronavirus  (SARS-CoV-2) nucleic acids may be present in the submitted sample  additional confirmatory testing may be necessary for epidemiological  and / or clinical management purposes  to differentiate between  SARS-CoV-2 and other  Sarbecovirus currently known to infect humans.  If clinically indicated additional testing with an alternate test  methodology [id number]) is advised. The SARS-CoV-2 RNA is generally  detectable in upper and lower respiratory sp ecimens during the acute  phase of infection. The expected result is Negative. Fact Sheet for Patients:  StrictlyIdeas.no Fact Sheet for Healthcare Providers: BankingDealers.co.za This test is not yet approved or cleared by the [place] FDA and has been authorized for detection and/or diagnosis of SARS-CoV-2 by FDA under an Emergency Use Authorization (EUA).  This EUA will remain in effect (meaning this test can be used) for the duration of the COVID-19 declaration under Section 564(b)(1) of the Act, 21 U.S.C. section 360bbb-3(b)(1), unless the authorization is terminated or revoked sooner. Performed at [facility], [place] [address] [place], [place] [address]          Radiology Studies: Mr Brain 28 Contrast  Result Date: 09/05/2019 CLINICAL DATA:  73 year old male code stroke presentation earlier today with right side weakness and leftward gaze. EXAM: MRI HEAD WITHOUT CONTRAST TECHNIQUE: Multiplanar, multiecho pulse sequences of the brain and surrounding structures were obtained  without intravenous contrast. COMPARISON:  Head CT without contrast 09/03/2019. FINDINGS: The examination had to be discontinued prior to completion due to patient agitation. Axial diffusion weighted imaging, motion degraded coronal diffusion weighted imaging, and motion degraded axial FLAIR imaging only was obtained. Heterogeneous diffusion in the right parietal lobe corresponding to the recent CTA abnormality is mostly facilitated (series 3, image 36 and series 300, image 36). Additionally there are multiple punctate cortical areas of restricted diffusion in both superior frontal convexities (series 3 images 46 through 51). These  are more numerous on the left. Occasional central white matter restricted diffusion. No deep gray nuclei or posterior fossa diffusion abnormality. The right parietal lobe lesion extends to the right MCA/PCA watershed area. No intracranial mass effect. No ventriculomegaly., as well patchy FLAIR as chronically hyperintensity in the affected areas scattered in the bilateral cerebral white matter. Chronic dilated perivascular space inferior to the left lentiform (normal variant). IMPRESSION: 1. The examination was discontinued prior to completion due to patient agitation. 2. Multiple scattered punctate mostly cortical acute infarcts in the superior frontal gyri suggest a recent Embolic Event. 3. Late subacute to early chronic infarct in the posterior right hemisphere as seen by CT, posterior right MCA and right MCA/PCA watershed area. 4. No intracranial mass effect. Electronically Signed   By: Genevie Ann M.D.   On: 09/05/2019 20:11        Scheduled Meds:  aspirin  325 mg Oral Daily   atorvastatin  10 mg Oral Daily   cholecalciferol  1,000 Units Oral Daily   docusate sodium  100 mg Oral BID   enoxaparin (LOVENOX) injection  40 mg Subcutaneous Q24H   morphine  30 mg Oral BID   pantoprazole  40 mg Oral Daily   sodium chloride flush  3 mL Intravenous Once   Continuous Infusions:  sodium chloride 75 mL/hr at 09/07/19 0309     LOS: 4 days    Time spent: 20 minutes    Barb Merino, MD [facility] Pager [phone]  If 7PM-7AM, please contact night-coverage [email or web address] Password [facility] 09/07/2019, 11:39 AM

## 2019-09-22 DEATH — deceased

## 2021-04-21 IMAGING — MR MR HEAD W/O CM
5 series · 48 of 48 positions shown · non-contrast
Comparison: Head CT without contrast 09/03/2019.

CLINICAL DATA: 73-year-old male code stroke presentation earlier
today with right side weakness and leftward gaze.

EXAM:
MRI HEAD WITHOUT CONTRAST
TECHNIQUE: Multiplanar, multiecho pulse sequences of the brain and surrounding
structures were obtained without intravenous contrast.

[Series 3: DWI · axial · 3.0mm · 1.09mm/px · z∈[-80,+87]mm · 19 of 114 slices shown (1 of 4)]
[im 1/114]
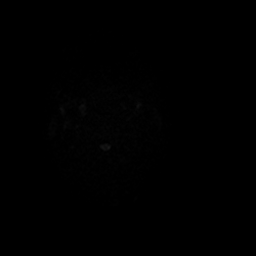
[im 7/114]
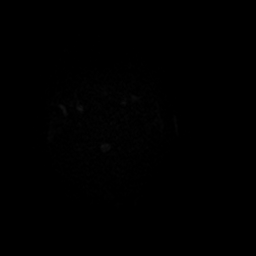
[im 13/114]
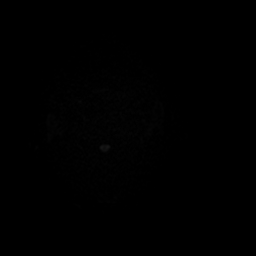
[im 19/114]
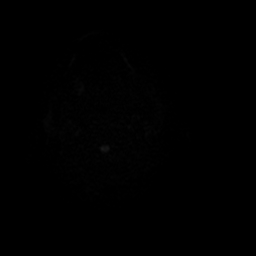
[im 26/114]
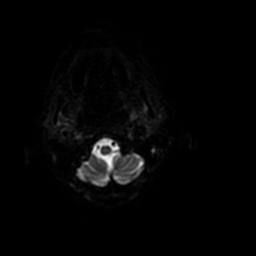
[im 32/114]
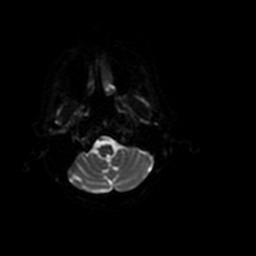
[im 38/114]
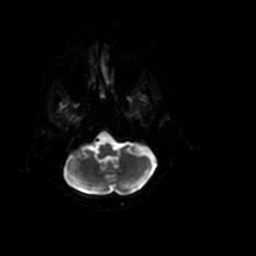
[im 44/114]
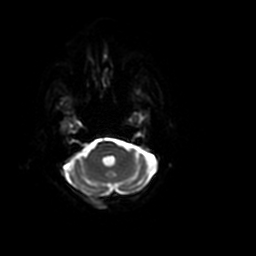
[im 51/114]
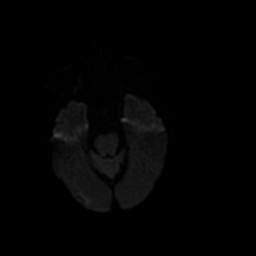
[im 57/114]
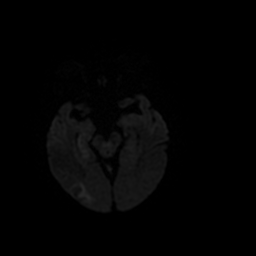
[im 63/114]
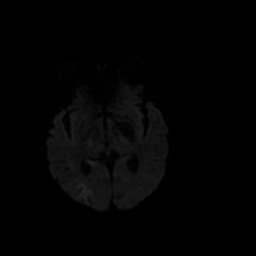
[im 70/114]
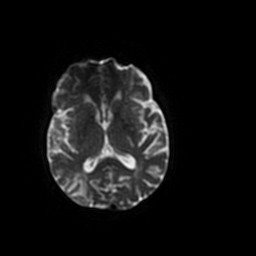
[im 76/114]
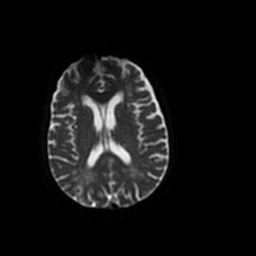
[im 82/114]
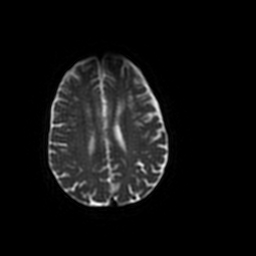
[im 88/114]
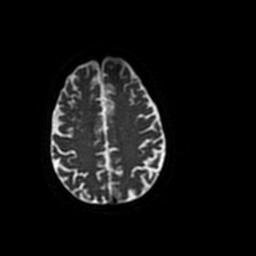
[im 95/114]
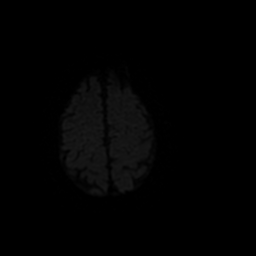
[im 101/114]
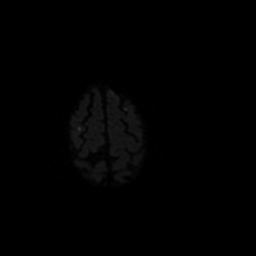
[im 107/114]
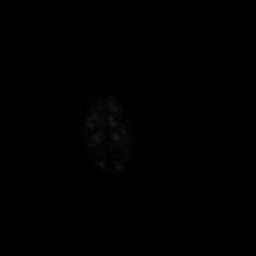
[im 114/114]
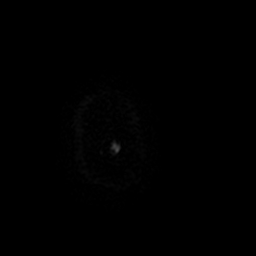

[Series 4: DWI · coronal · 5.0mm · 1.09mm/px · 12 of 76 slices shown (2 of 4)]
[im 1/76]
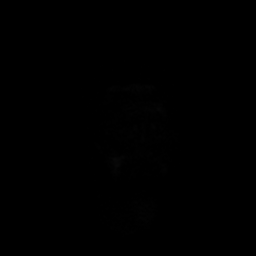
[im 7/76]
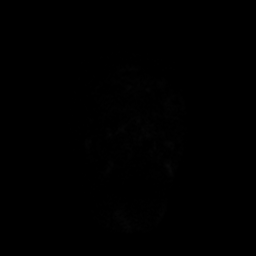
[im 14/76]
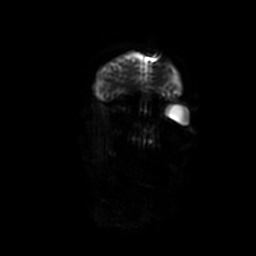
[im 21/76]
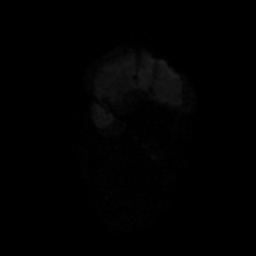
[im 28/76]
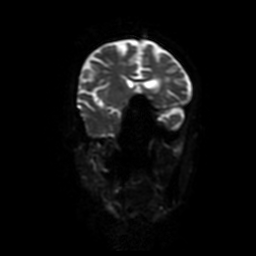
[im 35/76]
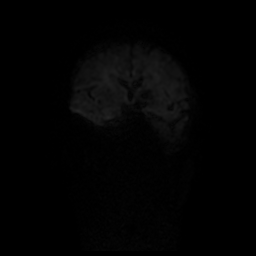
[im 41/76]
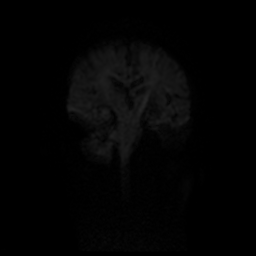
[im 48/76]
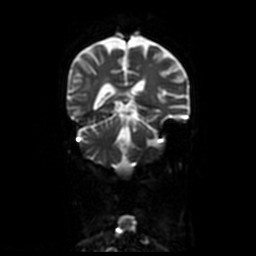
[im 55/76]
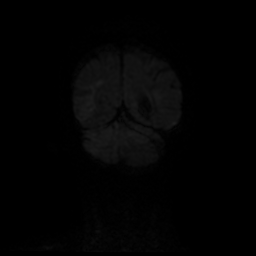
[im 62/76]
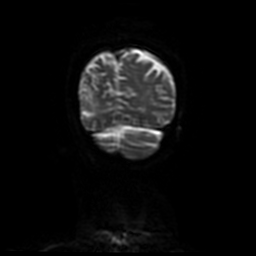
[im 69/76]
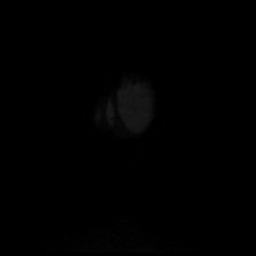
[im 76/76]
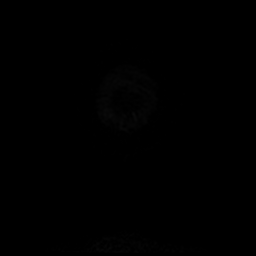

[Series 5: FLAIR · axial · 3.0mm · 0.43mm/px · z∈[-60,+83]mm · 2 of 13 slices shown]
[im 1/13]
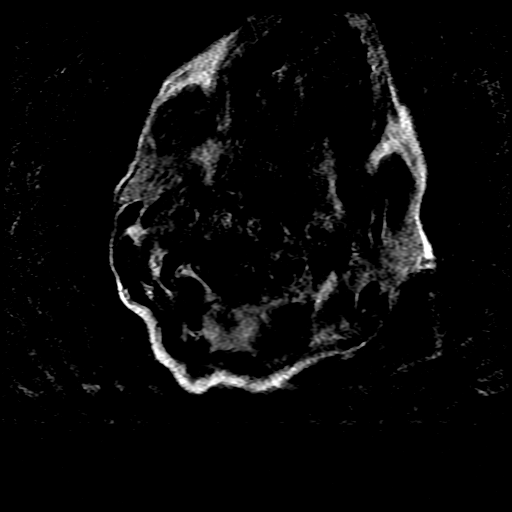
[im 13/13]
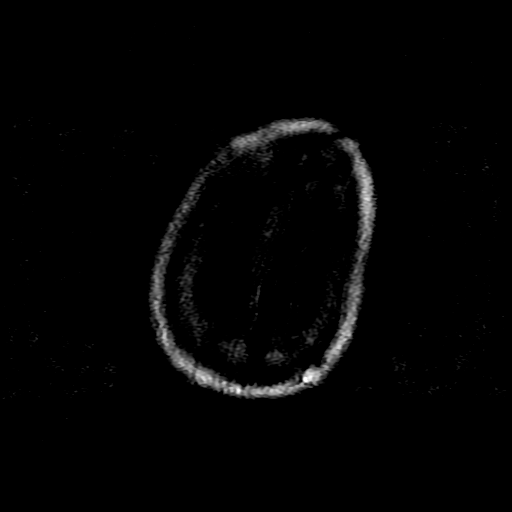

[Series 300: DWI · axial · 3.0mm · 1.09mm/px · z∈[-80,+87]mm · 9 of 57 slices shown (3 of 4)]
[im 1/57]
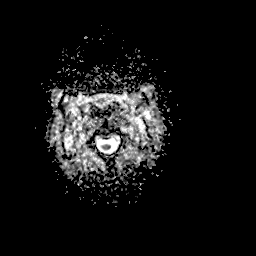
[im 8/57]
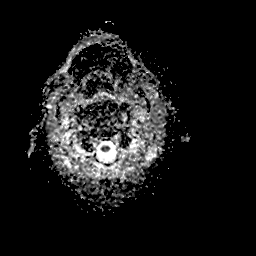
[im 15/57]
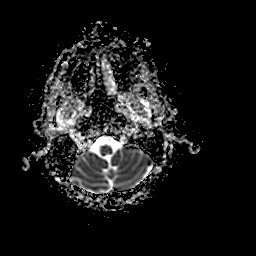
[im 22/57]
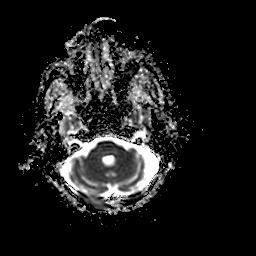
[im 29/57]
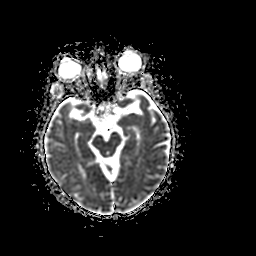
[im 36/57]
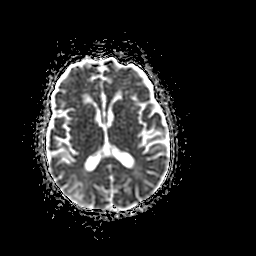
[im 43/57]
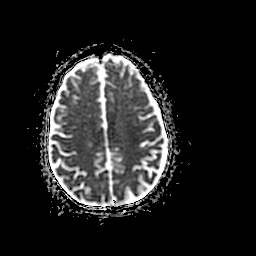
[im 50/57]
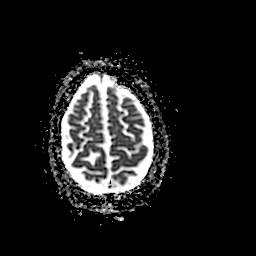
[im 57/57]
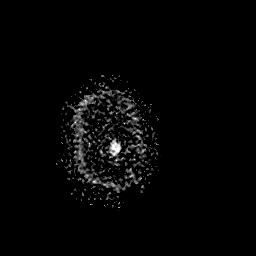

[Series 400: DWI · coronal · 5.0mm · 1.09mm/px · 6 of 38 slices shown (4 of 4)]
[im 1/38]
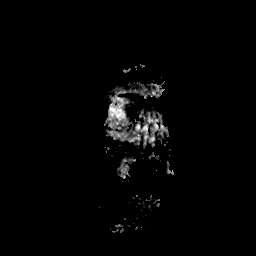
[im 8/38]
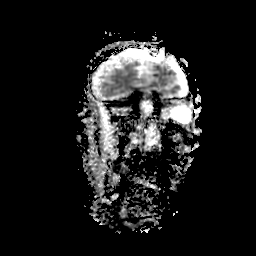
[im 15/38]
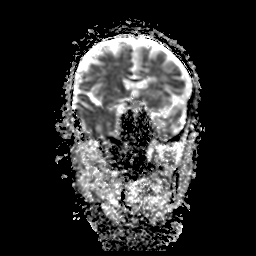
[im 23/38]
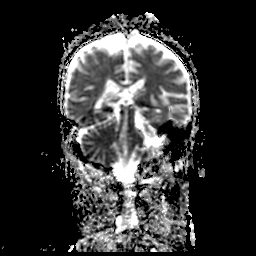
[im 30/38]
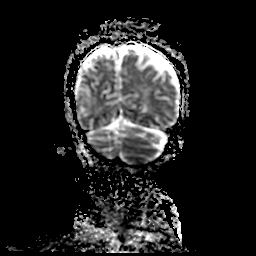
[im 38/38]
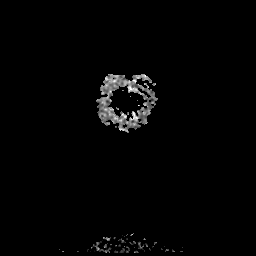

[48 of 48 positions shown; findings below may reference images not displayed]

FINDINGS: The examination had to be discontinued prior to completion due to
patient agitation.

Axial diffusion weighted imaging, motion degraded coronal diffusion
weighted imaging, and motion degraded axial FLAIR imaging only was
obtained.

Heterogeneous diffusion in the right parietal lobe corresponding to
the recent CTA abnormality is mostly facilitated (series 3, image 36
and series 300, image 36).

Additionally there are multiple punctate cortical areas of
restricted diffusion in both superior frontal convexities (series 3
images 46 through 51). These are more numerous on the left.
Occasional central white matter restricted diffusion.

No deep gray nuclei or posterior fossa diffusion abnormality. The
right parietal lobe lesion extends to the right MCA/PCA watershed
area.

No intracranial mass effect. No ventriculomegaly., as well patchy
FLAIR as chronically hyperintensity in the affected areas scattered
in the bilateral cerebral white matter.

Chronic dilated perivascular space inferior to the left lentiform
(normal variant).
IMPRESSION: 1. The examination was discontinued prior to completion due to
patient agitation.
2. Multiple scattered punctate mostly cortical acute infarcts in the
superior frontal gyri suggest a recent Embolic Event.
3. Late subacute to early chronic infarct in the posterior right
hemisphere as seen by CT, posterior right MCA and right MCA/PCA
watershed area.
4. No intracranial mass effect.
